# Patient Record
Sex: Female | Born: 1935 | Race: White | Hispanic: No | Marital: Married | Smoking: Never smoker
Health system: Southern US, Community
[De-identification: ages and names within clinical notes are randomized; demographics above are authoritative.]

## PROBLEM LIST (undated history)

## (undated) DIAGNOSIS — I2699 Other pulmonary embolism without acute cor pulmonale: Secondary | ICD-10-CM

## (undated) DIAGNOSIS — F419 Anxiety disorder, unspecified: Secondary | ICD-10-CM

## (undated) DIAGNOSIS — I10 Essential (primary) hypertension: Secondary | ICD-10-CM

## (undated) DIAGNOSIS — M1990 Unspecified osteoarthritis, unspecified site: Secondary | ICD-10-CM

## (undated) DIAGNOSIS — G43909 Migraine, unspecified, not intractable, without status migrainosus: Secondary | ICD-10-CM

## (undated) DIAGNOSIS — T4145XA Adverse effect of unspecified anesthetic, initial encounter: Secondary | ICD-10-CM

## (undated) DIAGNOSIS — K219 Gastro-esophageal reflux disease without esophagitis: Secondary | ICD-10-CM

## (undated) DIAGNOSIS — F329 Major depressive disorder, single episode, unspecified: Secondary | ICD-10-CM

## (undated) DIAGNOSIS — T8859XA Other complications of anesthesia, initial encounter: Secondary | ICD-10-CM

## (undated) DIAGNOSIS — R112 Nausea with vomiting, unspecified: Secondary | ICD-10-CM

## (undated) DIAGNOSIS — Z9889 Other specified postprocedural states: Secondary | ICD-10-CM

## (undated) DIAGNOSIS — F32A Depression, unspecified: Secondary | ICD-10-CM

## (undated) HISTORY — DX: Essential (primary) hypertension: I10

## (undated) HISTORY — PX: FOOT SURGERY: SHX648

## (undated) HISTORY — PX: FINGER AMPUTATION: SHX636

## (undated) HISTORY — DX: Gastro-esophageal reflux disease without esophagitis: K21.9

## (undated) HISTORY — PX: CHOLECYSTECTOMY: SHX55

## (undated) HISTORY — PX: CYST EXCISION: SHX5701

## (undated) HISTORY — DX: Anxiety disorder, unspecified: F41.9

## (undated) HISTORY — PX: SHOULDER ARTHROSCOPY: SHX128

## (undated) HISTORY — DX: Unspecified osteoarthritis, unspecified site: M19.90

## (undated) HISTORY — DX: Other pulmonary embolism without acute cor pulmonale: I26.99

## (undated) HISTORY — DX: Depression, unspecified: F32.A

## (undated) HISTORY — DX: Migraine, unspecified, not intractable, without status migrainosus: G43.909

---
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
## 1969-11-04 HISTORY — PX: HEMORRHOID SURGERY: SHX153

## 1970-11-04 HISTORY — PX: ABDOMINAL HYSTERECTOMY: SHX81

## 1971-11-05 HISTORY — PX: APPENDECTOMY: SHX54

## 1971-11-05 HISTORY — PX: TONSILLECTOMY: SUR1361

## 2003-11-05 HISTORY — PX: KNEE SURGERY: SHX244

## 2014-11-04 HISTORY — PX: OTHER SURGICAL HISTORY: SHX169

## 2016-03-01 DIAGNOSIS — L2089 Other atopic dermatitis: Secondary | ICD-10-CM | POA: Diagnosis not present

## 2016-03-01 DIAGNOSIS — Z6830 Body mass index (BMI) 30.0-30.9, adult: Secondary | ICD-10-CM | POA: Diagnosis not present

## 2016-03-01 DIAGNOSIS — I10 Essential (primary) hypertension: Secondary | ICD-10-CM | POA: Diagnosis not present

## 2016-03-01 DIAGNOSIS — Z0000 Encounter for general adult medical examination without abnormal findings: Secondary | ICD-10-CM | POA: Diagnosis not present

## 2016-03-01 DIAGNOSIS — Z1389 Encounter for screening for other disorder: Secondary | ICD-10-CM | POA: Diagnosis not present

## 2016-04-03 DIAGNOSIS — I700 Atherosclerosis of aorta: Secondary | ICD-10-CM | POA: Diagnosis not present

## 2016-04-03 DIAGNOSIS — M47816 Spondylosis without myelopathy or radiculopathy, lumbar region: Secondary | ICD-10-CM | POA: Diagnosis not present

## 2016-04-03 DIAGNOSIS — M545 Low back pain: Secondary | ICD-10-CM | POA: Diagnosis not present

## 2016-04-03 DIAGNOSIS — M5432 Sciatica, left side: Secondary | ICD-10-CM | POA: Diagnosis not present

## 2016-04-03 DIAGNOSIS — M4316 Spondylolisthesis, lumbar region: Secondary | ICD-10-CM | POA: Diagnosis not present

## 2016-04-03 DIAGNOSIS — Z6829 Body mass index (BMI) 29.0-29.9, adult: Secondary | ICD-10-CM | POA: Diagnosis not present

## 2016-04-22 DIAGNOSIS — M5432 Sciatica, left side: Secondary | ICD-10-CM | POA: Diagnosis not present

## 2016-04-22 DIAGNOSIS — Z6830 Body mass index (BMI) 30.0-30.9, adult: Secondary | ICD-10-CM | POA: Diagnosis not present

## 2016-04-22 DIAGNOSIS — M545 Low back pain: Secondary | ICD-10-CM | POA: Diagnosis not present

## 2016-04-26 DIAGNOSIS — M4806 Spinal stenosis, lumbar region: Secondary | ICD-10-CM | POA: Diagnosis not present

## 2016-04-26 DIAGNOSIS — M5135 Other intervertebral disc degeneration, thoracolumbar region: Secondary | ICD-10-CM | POA: Diagnosis not present

## 2016-04-26 DIAGNOSIS — K5730 Diverticulosis of large intestine without perforation or abscess without bleeding: Secondary | ICD-10-CM | POA: Diagnosis not present

## 2016-04-26 DIAGNOSIS — S3992XA Unspecified injury of lower back, initial encounter: Secondary | ICD-10-CM | POA: Diagnosis not present

## 2016-04-26 DIAGNOSIS — M545 Low back pain: Secondary | ICD-10-CM | POA: Diagnosis not present

## 2016-04-26 DIAGNOSIS — I700 Atherosclerosis of aorta: Secondary | ICD-10-CM | POA: Diagnosis not present

## 2016-05-20 DIAGNOSIS — M4317 Spondylolisthesis, lumbosacral region: Secondary | ICD-10-CM | POA: Diagnosis not present

## 2016-05-29 DIAGNOSIS — M4697 Unspecified inflammatory spondylopathy, lumbosacral region: Secondary | ICD-10-CM | POA: Diagnosis not present

## 2016-05-29 DIAGNOSIS — M5127 Other intervertebral disc displacement, lumbosacral region: Secondary | ICD-10-CM | POA: Diagnosis not present

## 2016-05-29 DIAGNOSIS — M9973 Connective tissue and disc stenosis of intervertebral foramina of lumbar region: Secondary | ICD-10-CM | POA: Diagnosis not present

## 2016-05-29 DIAGNOSIS — M4696 Unspecified inflammatory spondylopathy, lumbar region: Secondary | ICD-10-CM | POA: Diagnosis not present

## 2016-05-29 DIAGNOSIS — M7138 Other bursal cyst, other site: Secondary | ICD-10-CM | POA: Diagnosis not present

## 2016-05-29 DIAGNOSIS — M5136 Other intervertebral disc degeneration, lumbar region: Secondary | ICD-10-CM | POA: Diagnosis not present

## 2016-05-29 DIAGNOSIS — M438X6 Other specified deforming dorsopathies, lumbar region: Secondary | ICD-10-CM | POA: Diagnosis not present

## 2016-06-05 DIAGNOSIS — M4807 Spinal stenosis, lumbosacral region: Secondary | ICD-10-CM | POA: Diagnosis not present

## 2016-06-05 DIAGNOSIS — M4317 Spondylolisthesis, lumbosacral region: Secondary | ICD-10-CM | POA: Diagnosis not present

## 2016-06-06 DIAGNOSIS — M545 Low back pain: Secondary | ICD-10-CM | POA: Diagnosis not present

## 2016-06-06 DIAGNOSIS — I10 Essential (primary) hypertension: Secondary | ICD-10-CM | POA: Diagnosis not present

## 2016-06-06 DIAGNOSIS — I700 Atherosclerosis of aorta: Secondary | ICD-10-CM | POA: Diagnosis not present

## 2016-06-06 DIAGNOSIS — M150 Primary generalized (osteo)arthritis: Secondary | ICD-10-CM | POA: Diagnosis not present

## 2016-06-07 DIAGNOSIS — I700 Atherosclerosis of aorta: Secondary | ICD-10-CM | POA: Diagnosis not present

## 2016-06-07 DIAGNOSIS — M545 Low back pain: Secondary | ICD-10-CM | POA: Diagnosis not present

## 2016-06-07 DIAGNOSIS — M5432 Sciatica, left side: Secondary | ICD-10-CM | POA: Diagnosis not present

## 2016-06-07 DIAGNOSIS — Z6831 Body mass index (BMI) 31.0-31.9, adult: Secondary | ICD-10-CM | POA: Diagnosis not present

## 2016-06-10 DIAGNOSIS — M5440 Lumbago with sciatica, unspecified side: Secondary | ICD-10-CM | POA: Diagnosis not present

## 2016-06-10 DIAGNOSIS — M5416 Radiculopathy, lumbar region: Secondary | ICD-10-CM | POA: Diagnosis not present

## 2016-06-11 DIAGNOSIS — M5416 Radiculopathy, lumbar region: Secondary | ICD-10-CM | POA: Diagnosis not present

## 2016-06-11 DIAGNOSIS — M5440 Lumbago with sciatica, unspecified side: Secondary | ICD-10-CM | POA: Diagnosis not present

## 2016-06-20 DIAGNOSIS — M5416 Radiculopathy, lumbar region: Secondary | ICD-10-CM | POA: Diagnosis not present

## 2016-07-05 DIAGNOSIS — M545 Low back pain: Secondary | ICD-10-CM | POA: Diagnosis not present

## 2016-07-05 DIAGNOSIS — I700 Atherosclerosis of aorta: Secondary | ICD-10-CM | POA: Diagnosis not present

## 2016-07-05 DIAGNOSIS — M150 Primary generalized (osteo)arthritis: Secondary | ICD-10-CM | POA: Diagnosis not present

## 2016-07-05 DIAGNOSIS — M5432 Sciatica, left side: Secondary | ICD-10-CM | POA: Diagnosis not present

## 2016-07-10 DIAGNOSIS — M5415 Radiculopathy, thoracolumbar region: Secondary | ICD-10-CM | POA: Diagnosis not present

## 2016-07-22 DIAGNOSIS — M5416 Radiculopathy, lumbar region: Secondary | ICD-10-CM | POA: Diagnosis not present

## 2016-07-22 DIAGNOSIS — M4317 Spondylolisthesis, lumbosacral region: Secondary | ICD-10-CM | POA: Diagnosis not present

## 2016-07-22 DIAGNOSIS — M47896 Other spondylosis, lumbar region: Secondary | ICD-10-CM | POA: Diagnosis not present

## 2016-07-22 DIAGNOSIS — M4316 Spondylolisthesis, lumbar region: Secondary | ICD-10-CM | POA: Diagnosis not present

## 2016-07-22 DIAGNOSIS — M47816 Spondylosis without myelopathy or radiculopathy, lumbar region: Secondary | ICD-10-CM | POA: Diagnosis not present

## 2016-07-22 DIAGNOSIS — M7138 Other bursal cyst, other site: Secondary | ICD-10-CM | POA: Diagnosis not present

## 2016-07-25 DIAGNOSIS — M545 Low back pain: Secondary | ICD-10-CM | POA: Diagnosis not present

## 2016-07-25 DIAGNOSIS — Z6830 Body mass index (BMI) 30.0-30.9, adult: Secondary | ICD-10-CM | POA: Diagnosis not present

## 2016-07-25 DIAGNOSIS — I700 Atherosclerosis of aorta: Secondary | ICD-10-CM | POA: Diagnosis not present

## 2016-08-01 DIAGNOSIS — K219 Gastro-esophageal reflux disease without esophagitis: Secondary | ICD-10-CM | POA: Diagnosis not present

## 2016-08-01 DIAGNOSIS — Z86718 Personal history of other venous thrombosis and embolism: Secondary | ICD-10-CM | POA: Diagnosis not present

## 2016-08-01 DIAGNOSIS — M5116 Intervertebral disc disorders with radiculopathy, lumbar region: Secondary | ICD-10-CM | POA: Diagnosis not present

## 2016-08-01 DIAGNOSIS — E7800 Pure hypercholesterolemia, unspecified: Secondary | ICD-10-CM | POA: Diagnosis not present

## 2016-08-01 DIAGNOSIS — M7138 Other bursal cyst, other site: Secondary | ICD-10-CM | POA: Diagnosis not present

## 2016-08-01 DIAGNOSIS — I10 Essential (primary) hypertension: Secondary | ICD-10-CM | POA: Diagnosis not present

## 2016-08-01 DIAGNOSIS — Z23 Encounter for immunization: Secondary | ICD-10-CM | POA: Diagnosis not present

## 2016-08-01 DIAGNOSIS — M5418 Radiculopathy, sacral and sacrococcygeal region: Secondary | ICD-10-CM | POA: Diagnosis not present

## 2016-08-01 DIAGNOSIS — R9431 Abnormal electrocardiogram [ECG] [EKG]: Secondary | ICD-10-CM | POA: Diagnosis not present

## 2016-08-01 DIAGNOSIS — F418 Other specified anxiety disorders: Secondary | ICD-10-CM | POA: Diagnosis not present

## 2016-08-12 DIAGNOSIS — M5417 Radiculopathy, lumbosacral region: Secondary | ICD-10-CM | POA: Diagnosis not present

## 2016-08-12 DIAGNOSIS — Z23 Encounter for immunization: Secondary | ICD-10-CM | POA: Diagnosis not present

## 2016-08-12 DIAGNOSIS — Z86718 Personal history of other venous thrombosis and embolism: Secondary | ICD-10-CM | POA: Diagnosis not present

## 2016-08-12 DIAGNOSIS — K219 Gastro-esophageal reflux disease without esophagitis: Secondary | ICD-10-CM | POA: Diagnosis not present

## 2016-08-12 DIAGNOSIS — F418 Other specified anxiety disorders: Secondary | ICD-10-CM | POA: Diagnosis not present

## 2016-08-12 DIAGNOSIS — M5116 Intervertebral disc disorders with radiculopathy, lumbar region: Secondary | ICD-10-CM | POA: Diagnosis not present

## 2016-08-12 DIAGNOSIS — M5418 Radiculopathy, sacral and sacrococcygeal region: Secondary | ICD-10-CM | POA: Diagnosis not present

## 2016-08-12 DIAGNOSIS — E7800 Pure hypercholesterolemia, unspecified: Secondary | ICD-10-CM | POA: Diagnosis not present

## 2016-08-12 DIAGNOSIS — M7138 Other bursal cyst, other site: Secondary | ICD-10-CM | POA: Diagnosis not present

## 2016-08-12 DIAGNOSIS — M5410 Radiculopathy, site unspecified: Secondary | ICD-10-CM | POA: Diagnosis not present

## 2016-08-12 DIAGNOSIS — I10 Essential (primary) hypertension: Secondary | ICD-10-CM | POA: Diagnosis not present

## 2016-08-15 DIAGNOSIS — M1990 Unspecified osteoarthritis, unspecified site: Secondary | ICD-10-CM | POA: Diagnosis not present

## 2016-08-15 DIAGNOSIS — F418 Other specified anxiety disorders: Secondary | ICD-10-CM | POA: Diagnosis not present

## 2016-08-15 DIAGNOSIS — I10 Essential (primary) hypertension: Secondary | ICD-10-CM | POA: Diagnosis not present

## 2016-08-15 DIAGNOSIS — Z79899 Other long term (current) drug therapy: Secondary | ICD-10-CM | POA: Diagnosis not present

## 2016-08-15 DIAGNOSIS — R51 Headache: Secondary | ICD-10-CM | POA: Diagnosis not present

## 2016-08-15 DIAGNOSIS — K449 Diaphragmatic hernia without obstruction or gangrene: Secondary | ICD-10-CM | POA: Diagnosis not present

## 2016-08-15 DIAGNOSIS — N959 Unspecified menopausal and perimenopausal disorder: Secondary | ICD-10-CM | POA: Diagnosis not present

## 2016-08-15 DIAGNOSIS — M810 Age-related osteoporosis without current pathological fracture: Secondary | ICD-10-CM | POA: Diagnosis not present

## 2016-08-15 DIAGNOSIS — G2581 Restless legs syndrome: Secondary | ICD-10-CM | POA: Diagnosis not present

## 2016-08-16 DIAGNOSIS — Z79899 Other long term (current) drug therapy: Secondary | ICD-10-CM | POA: Diagnosis not present

## 2016-08-16 DIAGNOSIS — I10 Essential (primary) hypertension: Secondary | ICD-10-CM | POA: Diagnosis not present

## 2016-08-16 DIAGNOSIS — F418 Other specified anxiety disorders: Secondary | ICD-10-CM | POA: Diagnosis not present

## 2016-08-16 DIAGNOSIS — M810 Age-related osteoporosis without current pathological fracture: Secondary | ICD-10-CM | POA: Diagnosis not present

## 2016-08-16 DIAGNOSIS — R51 Headache: Secondary | ICD-10-CM | POA: Diagnosis not present

## 2016-08-16 DIAGNOSIS — M1990 Unspecified osteoarthritis, unspecified site: Secondary | ICD-10-CM | POA: Diagnosis not present

## 2016-08-16 DIAGNOSIS — K449 Diaphragmatic hernia without obstruction or gangrene: Secondary | ICD-10-CM | POA: Diagnosis not present

## 2016-08-16 DIAGNOSIS — N959 Unspecified menopausal and perimenopausal disorder: Secondary | ICD-10-CM | POA: Diagnosis not present

## 2016-08-16 DIAGNOSIS — G2581 Restless legs syndrome: Secondary | ICD-10-CM | POA: Diagnosis not present

## 2016-08-17 DIAGNOSIS — F418 Other specified anxiety disorders: Secondary | ICD-10-CM | POA: Diagnosis not present

## 2016-08-17 DIAGNOSIS — M810 Age-related osteoporosis without current pathological fracture: Secondary | ICD-10-CM | POA: Diagnosis not present

## 2016-08-17 DIAGNOSIS — I10 Essential (primary) hypertension: Secondary | ICD-10-CM | POA: Diagnosis not present

## 2016-08-17 DIAGNOSIS — R51 Headache: Secondary | ICD-10-CM | POA: Diagnosis not present

## 2016-08-17 DIAGNOSIS — K449 Diaphragmatic hernia without obstruction or gangrene: Secondary | ICD-10-CM | POA: Diagnosis not present

## 2016-08-17 DIAGNOSIS — Z79899 Other long term (current) drug therapy: Secondary | ICD-10-CM | POA: Diagnosis not present

## 2016-08-17 DIAGNOSIS — M1990 Unspecified osteoarthritis, unspecified site: Secondary | ICD-10-CM | POA: Diagnosis not present

## 2016-08-17 DIAGNOSIS — G2581 Restless legs syndrome: Secondary | ICD-10-CM | POA: Diagnosis not present

## 2016-08-17 DIAGNOSIS — N959 Unspecified menopausal and perimenopausal disorder: Secondary | ICD-10-CM | POA: Diagnosis not present

## 2016-08-22 DIAGNOSIS — M545 Low back pain: Secondary | ICD-10-CM | POA: Diagnosis not present

## 2016-08-22 DIAGNOSIS — I700 Atherosclerosis of aorta: Secondary | ICD-10-CM | POA: Diagnosis not present

## 2016-08-22 DIAGNOSIS — Z6831 Body mass index (BMI) 31.0-31.9, adult: Secondary | ICD-10-CM | POA: Diagnosis not present

## 2016-09-24 DIAGNOSIS — I10 Essential (primary) hypertension: Secondary | ICD-10-CM | POA: Diagnosis not present

## 2016-09-24 DIAGNOSIS — I700 Atherosclerosis of aorta: Secondary | ICD-10-CM | POA: Diagnosis not present

## 2016-09-24 DIAGNOSIS — M545 Low back pain: Secondary | ICD-10-CM | POA: Diagnosis not present

## 2016-09-24 DIAGNOSIS — M150 Primary generalized (osteo)arthritis: Secondary | ICD-10-CM | POA: Diagnosis not present

## 2016-10-29 DIAGNOSIS — M150 Primary generalized (osteo)arthritis: Secondary | ICD-10-CM | POA: Diagnosis not present

## 2016-10-29 DIAGNOSIS — I700 Atherosclerosis of aorta: Secondary | ICD-10-CM | POA: Diagnosis not present

## 2016-10-29 DIAGNOSIS — I10 Essential (primary) hypertension: Secondary | ICD-10-CM | POA: Diagnosis not present

## 2016-10-29 DIAGNOSIS — M545 Low back pain: Secondary | ICD-10-CM | POA: Diagnosis not present

## 2016-11-21 DIAGNOSIS — I10 Essential (primary) hypertension: Secondary | ICD-10-CM | POA: Diagnosis not present

## 2016-11-21 DIAGNOSIS — I700 Atherosclerosis of aorta: Secondary | ICD-10-CM | POA: Diagnosis not present

## 2016-11-21 DIAGNOSIS — M150 Primary generalized (osteo)arthritis: Secondary | ICD-10-CM | POA: Diagnosis not present

## 2016-11-21 DIAGNOSIS — M545 Low back pain: Secondary | ICD-10-CM | POA: Diagnosis not present

## 2016-11-29 DIAGNOSIS — M545 Low back pain: Secondary | ICD-10-CM | POA: Diagnosis not present

## 2016-11-29 DIAGNOSIS — Z79899 Other long term (current) drug therapy: Secondary | ICD-10-CM | POA: Diagnosis not present

## 2016-11-29 DIAGNOSIS — I10 Essential (primary) hypertension: Secondary | ICD-10-CM | POA: Diagnosis not present

## 2016-11-29 DIAGNOSIS — Z6831 Body mass index (BMI) 31.0-31.9, adult: Secondary | ICD-10-CM | POA: Diagnosis not present

## 2016-11-29 DIAGNOSIS — I700 Atherosclerosis of aorta: Secondary | ICD-10-CM | POA: Diagnosis not present

## 2016-12-13 DIAGNOSIS — M545 Low back pain: Secondary | ICD-10-CM | POA: Diagnosis not present

## 2016-12-13 DIAGNOSIS — I700 Atherosclerosis of aorta: Secondary | ICD-10-CM | POA: Diagnosis not present

## 2016-12-13 DIAGNOSIS — I10 Essential (primary) hypertension: Secondary | ICD-10-CM | POA: Diagnosis not present

## 2016-12-13 DIAGNOSIS — M150 Primary generalized (osteo)arthritis: Secondary | ICD-10-CM | POA: Diagnosis not present

## 2016-12-30 DIAGNOSIS — M19071 Primary osteoarthritis, right ankle and foot: Secondary | ICD-10-CM | POA: Diagnosis not present

## 2017-01-07 DIAGNOSIS — M150 Primary generalized (osteo)arthritis: Secondary | ICD-10-CM | POA: Diagnosis not present

## 2017-01-07 DIAGNOSIS — M545 Low back pain: Secondary | ICD-10-CM | POA: Diagnosis not present

## 2017-01-07 DIAGNOSIS — I10 Essential (primary) hypertension: Secondary | ICD-10-CM | POA: Diagnosis not present

## 2017-01-07 DIAGNOSIS — I700 Atherosclerosis of aorta: Secondary | ICD-10-CM | POA: Diagnosis not present

## 2017-02-10 DIAGNOSIS — I10 Essential (primary) hypertension: Secondary | ICD-10-CM | POA: Diagnosis not present

## 2017-02-10 DIAGNOSIS — I700 Atherosclerosis of aorta: Secondary | ICD-10-CM | POA: Diagnosis not present

## 2017-02-10 DIAGNOSIS — M545 Low back pain: Secondary | ICD-10-CM | POA: Diagnosis not present

## 2017-02-10 DIAGNOSIS — M150 Primary generalized (osteo)arthritis: Secondary | ICD-10-CM | POA: Diagnosis not present

## 2017-02-21 DIAGNOSIS — M545 Low back pain: Secondary | ICD-10-CM | POA: Diagnosis not present

## 2017-02-21 DIAGNOSIS — Z6831 Body mass index (BMI) 31.0-31.9, adult: Secondary | ICD-10-CM | POA: Diagnosis not present

## 2017-02-21 DIAGNOSIS — I700 Atherosclerosis of aorta: Secondary | ICD-10-CM | POA: Diagnosis not present

## 2017-02-21 DIAGNOSIS — I10 Essential (primary) hypertension: Secondary | ICD-10-CM | POA: Diagnosis not present

## 2017-03-24 DIAGNOSIS — M545 Low back pain: Secondary | ICD-10-CM | POA: Diagnosis not present

## 2017-03-24 DIAGNOSIS — I700 Atherosclerosis of aorta: Secondary | ICD-10-CM | POA: Diagnosis not present

## 2017-03-24 DIAGNOSIS — I10 Essential (primary) hypertension: Secondary | ICD-10-CM | POA: Diagnosis not present

## 2017-04-28 DIAGNOSIS — M545 Low back pain: Secondary | ICD-10-CM | POA: Diagnosis not present

## 2017-04-28 DIAGNOSIS — I700 Atherosclerosis of aorta: Secondary | ICD-10-CM | POA: Diagnosis not present

## 2017-04-28 DIAGNOSIS — I10 Essential (primary) hypertension: Secondary | ICD-10-CM | POA: Diagnosis not present

## 2017-05-29 DIAGNOSIS — I700 Atherosclerosis of aorta: Secondary | ICD-10-CM | POA: Diagnosis not present

## 2017-05-29 DIAGNOSIS — I10 Essential (primary) hypertension: Secondary | ICD-10-CM | POA: Diagnosis not present

## 2017-05-29 DIAGNOSIS — L308 Other specified dermatitis: Secondary | ICD-10-CM | POA: Diagnosis not present

## 2017-05-29 DIAGNOSIS — M545 Low back pain: Secondary | ICD-10-CM | POA: Diagnosis not present

## 2017-05-29 DIAGNOSIS — Z6829 Body mass index (BMI) 29.0-29.9, adult: Secondary | ICD-10-CM | POA: Diagnosis not present

## 2017-05-29 DIAGNOSIS — K219 Gastro-esophageal reflux disease without esophagitis: Secondary | ICD-10-CM | POA: Diagnosis not present

## 2017-06-17 DIAGNOSIS — I10 Essential (primary) hypertension: Secondary | ICD-10-CM | POA: Diagnosis not present

## 2017-06-17 DIAGNOSIS — M545 Low back pain: Secondary | ICD-10-CM | POA: Diagnosis not present

## 2017-06-17 DIAGNOSIS — I700 Atherosclerosis of aorta: Secondary | ICD-10-CM | POA: Diagnosis not present

## 2017-06-17 DIAGNOSIS — M150 Primary generalized (osteo)arthritis: Secondary | ICD-10-CM | POA: Diagnosis not present

## 2017-07-15 DIAGNOSIS — M150 Primary generalized (osteo)arthritis: Secondary | ICD-10-CM | POA: Diagnosis not present

## 2017-07-15 DIAGNOSIS — I10 Essential (primary) hypertension: Secondary | ICD-10-CM | POA: Diagnosis not present

## 2017-07-15 DIAGNOSIS — M545 Low back pain: Secondary | ICD-10-CM | POA: Diagnosis not present

## 2017-08-29 DIAGNOSIS — I10 Essential (primary) hypertension: Secondary | ICD-10-CM | POA: Diagnosis not present

## 2017-08-29 DIAGNOSIS — M150 Primary generalized (osteo)arthritis: Secondary | ICD-10-CM | POA: Diagnosis not present

## 2017-08-29 DIAGNOSIS — M545 Low back pain: Secondary | ICD-10-CM | POA: Diagnosis not present

## 2017-09-01 DIAGNOSIS — M545 Low back pain: Secondary | ICD-10-CM | POA: Diagnosis not present

## 2017-09-01 DIAGNOSIS — I10 Essential (primary) hypertension: Secondary | ICD-10-CM | POA: Diagnosis not present

## 2017-09-01 DIAGNOSIS — Z1389 Encounter for screening for other disorder: Secondary | ICD-10-CM | POA: Diagnosis not present

## 2017-09-01 DIAGNOSIS — Z6830 Body mass index (BMI) 30.0-30.9, adult: Secondary | ICD-10-CM | POA: Diagnosis not present

## 2017-09-01 DIAGNOSIS — K219 Gastro-esophageal reflux disease without esophagitis: Secondary | ICD-10-CM | POA: Diagnosis not present

## 2017-09-01 DIAGNOSIS — L308 Other specified dermatitis: Secondary | ICD-10-CM | POA: Diagnosis not present

## 2017-09-01 DIAGNOSIS — Z6829 Body mass index (BMI) 29.0-29.9, adult: Secondary | ICD-10-CM | POA: Diagnosis not present

## 2017-09-01 DIAGNOSIS — Z0000 Encounter for general adult medical examination without abnormal findings: Secondary | ICD-10-CM | POA: Diagnosis not present

## 2017-09-01 DIAGNOSIS — I700 Atherosclerosis of aorta: Secondary | ICD-10-CM | POA: Diagnosis not present

## 2017-10-06 DIAGNOSIS — K219 Gastro-esophageal reflux disease without esophagitis: Secondary | ICD-10-CM | POA: Diagnosis not present

## 2017-10-06 DIAGNOSIS — M545 Low back pain: Secondary | ICD-10-CM | POA: Diagnosis not present

## 2017-10-06 DIAGNOSIS — I10 Essential (primary) hypertension: Secondary | ICD-10-CM | POA: Diagnosis not present

## 2017-10-14 DIAGNOSIS — M810 Age-related osteoporosis without current pathological fracture: Secondary | ICD-10-CM | POA: Diagnosis not present

## 2017-10-14 DIAGNOSIS — M85851 Other specified disorders of bone density and structure, right thigh: Secondary | ICD-10-CM | POA: Diagnosis not present

## 2017-11-05 DIAGNOSIS — M545 Low back pain: Secondary | ICD-10-CM | POA: Diagnosis not present

## 2017-11-05 DIAGNOSIS — K219 Gastro-esophageal reflux disease without esophagitis: Secondary | ICD-10-CM | POA: Diagnosis not present

## 2017-11-05 DIAGNOSIS — I10 Essential (primary) hypertension: Secondary | ICD-10-CM | POA: Diagnosis not present

## 2017-11-13 DIAGNOSIS — M069 Rheumatoid arthritis, unspecified: Secondary | ICD-10-CM | POA: Diagnosis not present

## 2017-11-13 DIAGNOSIS — I10 Essential (primary) hypertension: Secondary | ICD-10-CM | POA: Diagnosis not present

## 2017-11-13 DIAGNOSIS — F341 Dysthymic disorder: Secondary | ICD-10-CM | POA: Diagnosis not present

## 2017-11-13 DIAGNOSIS — S62631B Displaced fracture of distal phalanx of left index finger, initial encounter for open fracture: Secondary | ICD-10-CM | POA: Diagnosis not present

## 2017-11-13 DIAGNOSIS — S6992XA Unspecified injury of left wrist, hand and finger(s), initial encounter: Secondary | ICD-10-CM | POA: Diagnosis not present

## 2017-11-13 DIAGNOSIS — W230XXA Caught, crushed, jammed, or pinched between moving objects, initial encounter: Secondary | ICD-10-CM | POA: Diagnosis not present

## 2017-11-13 DIAGNOSIS — T402X5A Adverse effect of other opioids, initial encounter: Secondary | ICD-10-CM | POA: Diagnosis not present

## 2017-11-13 DIAGNOSIS — Z79899 Other long term (current) drug therapy: Secondary | ICD-10-CM | POA: Diagnosis not present

## 2017-11-13 DIAGNOSIS — S6990XA Unspecified injury of unspecified wrist, hand and finger(s), initial encounter: Secondary | ICD-10-CM | POA: Diagnosis not present

## 2017-11-13 DIAGNOSIS — Y9269 Other specified industrial and construction area as the place of occurrence of the external cause: Secondary | ICD-10-CM | POA: Diagnosis not present

## 2017-11-13 DIAGNOSIS — S62625A Displaced fracture of medial phalanx of left ring finger, initial encounter for closed fracture: Secondary | ICD-10-CM | POA: Diagnosis not present

## 2017-11-13 DIAGNOSIS — E7800 Pure hypercholesterolemia, unspecified: Secondary | ICD-10-CM | POA: Diagnosis not present

## 2017-11-13 DIAGNOSIS — R030 Elevated blood-pressure reading, without diagnosis of hypertension: Secondary | ICD-10-CM | POA: Diagnosis not present

## 2017-11-13 DIAGNOSIS — J9811 Atelectasis: Secondary | ICD-10-CM | POA: Diagnosis not present

## 2017-11-13 DIAGNOSIS — S68121A Partial traumatic metacarpophalangeal amputation of left index finger, initial encounter: Secondary | ICD-10-CM | POA: Diagnosis not present

## 2017-11-13 DIAGNOSIS — T50905A Adverse effect of unspecified drugs, medicaments and biological substances, initial encounter: Secondary | ICD-10-CM | POA: Diagnosis not present

## 2017-11-13 DIAGNOSIS — Z23 Encounter for immunization: Secondary | ICD-10-CM | POA: Diagnosis not present

## 2017-11-13 DIAGNOSIS — R9431 Abnormal electrocardiogram [ECG] [EKG]: Secondary | ICD-10-CM | POA: Diagnosis not present

## 2017-11-13 DIAGNOSIS — S61313A Laceration without foreign body of left middle finger with damage to nail, initial encounter: Secondary | ICD-10-CM | POA: Diagnosis not present

## 2017-11-13 DIAGNOSIS — S68611A Complete traumatic transphalangeal amputation of left index finger, initial encounter: Secondary | ICD-10-CM | POA: Diagnosis not present

## 2017-11-13 DIAGNOSIS — M62838 Other muscle spasm: Secondary | ICD-10-CM | POA: Diagnosis not present

## 2017-11-13 DIAGNOSIS — Y990 Civilian activity done for income or pay: Secondary | ICD-10-CM | POA: Diagnosis not present

## 2017-11-14 DIAGNOSIS — Z23 Encounter for immunization: Secondary | ICD-10-CM | POA: Diagnosis not present

## 2017-11-14 DIAGNOSIS — S68611A Complete traumatic transphalangeal amputation of left index finger, initial encounter: Secondary | ICD-10-CM | POA: Diagnosis not present

## 2017-11-14 DIAGNOSIS — S61313A Laceration without foreign body of left middle finger with damage to nail, initial encounter: Secondary | ICD-10-CM | POA: Diagnosis not present

## 2017-11-14 DIAGNOSIS — S62625A Displaced fracture of medial phalanx of left ring finger, initial encounter for closed fracture: Secondary | ICD-10-CM | POA: Diagnosis not present

## 2017-11-21 DIAGNOSIS — S68111D Complete traumatic metacarpophalangeal amputation of left index finger, subsequent encounter: Secondary | ICD-10-CM | POA: Diagnosis not present

## 2017-12-03 DIAGNOSIS — S62625D Displaced fracture of medial phalanx of left ring finger, subsequent encounter for fracture with routine healing: Secondary | ICD-10-CM | POA: Diagnosis not present

## 2017-12-03 DIAGNOSIS — S68623D Partial traumatic transphalangeal amputation of left middle finger, subsequent encounter: Secondary | ICD-10-CM | POA: Diagnosis not present

## 2017-12-03 DIAGNOSIS — S61223D Laceration with foreign body of left middle finger without damage to nail, subsequent encounter: Secondary | ICD-10-CM | POA: Diagnosis not present

## 2017-12-03 DIAGNOSIS — M85842 Other specified disorders of bone density and structure, left hand: Secondary | ICD-10-CM | POA: Diagnosis not present

## 2017-12-03 DIAGNOSIS — M19042 Primary osteoarthritis, left hand: Secondary | ICD-10-CM | POA: Diagnosis not present

## 2017-12-03 DIAGNOSIS — S68621D Partial traumatic transphalangeal amputation of left index finger, subsequent encounter: Secondary | ICD-10-CM | POA: Diagnosis not present

## 2017-12-03 DIAGNOSIS — S63042D Subluxation of carpometacarpal joint of left thumb, subsequent encounter: Secondary | ICD-10-CM | POA: Diagnosis not present

## 2017-12-03 DIAGNOSIS — S68111D Complete traumatic metacarpophalangeal amputation of left index finger, subsequent encounter: Secondary | ICD-10-CM | POA: Diagnosis not present

## 2017-12-04 DIAGNOSIS — I700 Atherosclerosis of aorta: Secondary | ICD-10-CM | POA: Diagnosis not present

## 2017-12-04 DIAGNOSIS — K219 Gastro-esophageal reflux disease without esophagitis: Secondary | ICD-10-CM | POA: Diagnosis not present

## 2017-12-04 DIAGNOSIS — Z6829 Body mass index (BMI) 29.0-29.9, adult: Secondary | ICD-10-CM | POA: Diagnosis not present

## 2017-12-04 DIAGNOSIS — I10 Essential (primary) hypertension: Secondary | ICD-10-CM | POA: Diagnosis not present

## 2017-12-05 DIAGNOSIS — K219 Gastro-esophageal reflux disease without esophagitis: Secondary | ICD-10-CM | POA: Diagnosis not present

## 2017-12-05 DIAGNOSIS — M150 Primary generalized (osteo)arthritis: Secondary | ICD-10-CM | POA: Diagnosis not present

## 2017-12-05 DIAGNOSIS — I10 Essential (primary) hypertension: Secondary | ICD-10-CM | POA: Diagnosis not present

## 2018-01-12 DIAGNOSIS — M150 Primary generalized (osteo)arthritis: Secondary | ICD-10-CM | POA: Diagnosis not present

## 2018-01-12 DIAGNOSIS — I10 Essential (primary) hypertension: Secondary | ICD-10-CM | POA: Diagnosis not present

## 2018-01-12 DIAGNOSIS — K219 Gastro-esophageal reflux disease without esophagitis: Secondary | ICD-10-CM | POA: Diagnosis not present

## 2018-01-13 DIAGNOSIS — S68111D Complete traumatic metacarpophalangeal amputation of left index finger, subsequent encounter: Secondary | ICD-10-CM | POA: Diagnosis not present

## 2018-01-13 DIAGNOSIS — S62625D Displaced fracture of medial phalanx of left ring finger, subsequent encounter for fracture with routine healing: Secondary | ICD-10-CM | POA: Diagnosis not present

## 2018-01-13 DIAGNOSIS — S61223D Laceration with foreign body of left middle finger without damage to nail, subsequent encounter: Secondary | ICD-10-CM | POA: Diagnosis not present

## 2018-01-13 DIAGNOSIS — Z89022 Acquired absence of left finger(s): Secondary | ICD-10-CM | POA: Diagnosis not present

## 2018-02-16 DIAGNOSIS — M545 Low back pain: Secondary | ICD-10-CM | POA: Diagnosis not present

## 2018-02-16 DIAGNOSIS — Z6828 Body mass index (BMI) 28.0-28.9, adult: Secondary | ICD-10-CM | POA: Diagnosis not present

## 2018-02-17 DIAGNOSIS — M5137 Other intervertebral disc degeneration, lumbosacral region: Secondary | ICD-10-CM | POA: Diagnosis not present

## 2018-02-17 DIAGNOSIS — M47814 Spondylosis without myelopathy or radiculopathy, thoracic region: Secondary | ICD-10-CM | POA: Diagnosis not present

## 2018-02-17 DIAGNOSIS — M4316 Spondylolisthesis, lumbar region: Secondary | ICD-10-CM | POA: Diagnosis not present

## 2018-02-17 DIAGNOSIS — M5136 Other intervertebral disc degeneration, lumbar region: Secondary | ICD-10-CM | POA: Diagnosis not present

## 2018-02-17 DIAGNOSIS — M545 Low back pain: Secondary | ICD-10-CM | POA: Diagnosis not present

## 2018-02-17 DIAGNOSIS — M40204 Unspecified kyphosis, thoracic region: Secondary | ICD-10-CM | POA: Diagnosis not present

## 2018-02-17 DIAGNOSIS — M4184 Other forms of scoliosis, thoracic region: Secondary | ICD-10-CM | POA: Diagnosis not present

## 2018-02-17 DIAGNOSIS — M5135 Other intervertebral disc degeneration, thoracolumbar region: Secondary | ICD-10-CM | POA: Diagnosis not present

## 2018-02-24 DIAGNOSIS — M549 Dorsalgia, unspecified: Secondary | ICD-10-CM | POA: Diagnosis not present

## 2018-02-24 DIAGNOSIS — M4316 Spondylolisthesis, lumbar region: Secondary | ICD-10-CM | POA: Diagnosis not present

## 2018-02-24 DIAGNOSIS — M419 Scoliosis, unspecified: Secondary | ICD-10-CM | POA: Diagnosis not present

## 2018-02-24 DIAGNOSIS — M5135 Other intervertebral disc degeneration, thoracolumbar region: Secondary | ICD-10-CM | POA: Diagnosis not present

## 2018-03-03 DIAGNOSIS — K219 Gastro-esophageal reflux disease without esophagitis: Secondary | ICD-10-CM | POA: Diagnosis not present

## 2018-03-03 DIAGNOSIS — M150 Primary generalized (osteo)arthritis: Secondary | ICD-10-CM | POA: Diagnosis not present

## 2018-03-03 DIAGNOSIS — I10 Essential (primary) hypertension: Secondary | ICD-10-CM | POA: Diagnosis not present

## 2018-03-11 DIAGNOSIS — F329 Major depressive disorder, single episode, unspecified: Secondary | ICD-10-CM | POA: Diagnosis not present

## 2018-03-11 DIAGNOSIS — M545 Low back pain: Secondary | ICD-10-CM | POA: Diagnosis not present

## 2018-03-11 DIAGNOSIS — I10 Essential (primary) hypertension: Secondary | ICD-10-CM | POA: Diagnosis not present

## 2018-03-11 DIAGNOSIS — E7849 Other hyperlipidemia: Secondary | ICD-10-CM | POA: Diagnosis not present

## 2018-03-11 DIAGNOSIS — K210 Gastro-esophageal reflux disease with esophagitis: Secondary | ICD-10-CM | POA: Diagnosis not present

## 2018-03-11 DIAGNOSIS — M546 Pain in thoracic spine: Secondary | ICD-10-CM | POA: Diagnosis not present

## 2018-03-11 DIAGNOSIS — Z6829 Body mass index (BMI) 29.0-29.9, adult: Secondary | ICD-10-CM | POA: Diagnosis not present

## 2018-03-13 DIAGNOSIS — M6281 Muscle weakness (generalized): Secondary | ICD-10-CM | POA: Diagnosis not present

## 2018-03-13 DIAGNOSIS — M4316 Spondylolisthesis, lumbar region: Secondary | ICD-10-CM | POA: Diagnosis not present

## 2018-03-13 DIAGNOSIS — M546 Pain in thoracic spine: Secondary | ICD-10-CM | POA: Diagnosis not present

## 2018-03-13 DIAGNOSIS — M545 Low back pain: Secondary | ICD-10-CM | POA: Diagnosis not present

## 2018-03-13 DIAGNOSIS — M5135 Other intervertebral disc degeneration, thoracolumbar region: Secondary | ICD-10-CM | POA: Diagnosis not present

## 2018-03-13 DIAGNOSIS — R2689 Other abnormalities of gait and mobility: Secondary | ICD-10-CM | POA: Diagnosis not present

## 2018-03-16 DIAGNOSIS — M5135 Other intervertebral disc degeneration, thoracolumbar region: Secondary | ICD-10-CM | POA: Diagnosis not present

## 2018-03-16 DIAGNOSIS — M546 Pain in thoracic spine: Secondary | ICD-10-CM | POA: Diagnosis not present

## 2018-03-16 DIAGNOSIS — R2689 Other abnormalities of gait and mobility: Secondary | ICD-10-CM | POA: Diagnosis not present

## 2018-03-16 DIAGNOSIS — M6281 Muscle weakness (generalized): Secondary | ICD-10-CM | POA: Diagnosis not present

## 2018-03-16 DIAGNOSIS — M4316 Spondylolisthesis, lumbar region: Secondary | ICD-10-CM | POA: Diagnosis not present

## 2018-03-16 DIAGNOSIS — M545 Low back pain: Secondary | ICD-10-CM | POA: Diagnosis not present

## 2018-03-19 DIAGNOSIS — M4316 Spondylolisthesis, lumbar region: Secondary | ICD-10-CM | POA: Diagnosis not present

## 2018-03-19 DIAGNOSIS — R2689 Other abnormalities of gait and mobility: Secondary | ICD-10-CM | POA: Diagnosis not present

## 2018-03-19 DIAGNOSIS — M545 Low back pain: Secondary | ICD-10-CM | POA: Diagnosis not present

## 2018-03-19 DIAGNOSIS — M6281 Muscle weakness (generalized): Secondary | ICD-10-CM | POA: Diagnosis not present

## 2018-03-19 DIAGNOSIS — M5135 Other intervertebral disc degeneration, thoracolumbar region: Secondary | ICD-10-CM | POA: Diagnosis not present

## 2018-03-19 DIAGNOSIS — M546 Pain in thoracic spine: Secondary | ICD-10-CM | POA: Diagnosis not present

## 2018-03-23 DIAGNOSIS — M546 Pain in thoracic spine: Secondary | ICD-10-CM | POA: Diagnosis not present

## 2018-03-23 DIAGNOSIS — M6281 Muscle weakness (generalized): Secondary | ICD-10-CM | POA: Diagnosis not present

## 2018-03-23 DIAGNOSIS — M545 Low back pain: Secondary | ICD-10-CM | POA: Diagnosis not present

## 2018-03-23 DIAGNOSIS — R2689 Other abnormalities of gait and mobility: Secondary | ICD-10-CM | POA: Diagnosis not present

## 2018-03-23 DIAGNOSIS — M4316 Spondylolisthesis, lumbar region: Secondary | ICD-10-CM | POA: Diagnosis not present

## 2018-03-23 DIAGNOSIS — M5135 Other intervertebral disc degeneration, thoracolumbar region: Secondary | ICD-10-CM | POA: Diagnosis not present

## 2018-03-26 DIAGNOSIS — M546 Pain in thoracic spine: Secondary | ICD-10-CM | POA: Diagnosis not present

## 2018-03-26 DIAGNOSIS — M6281 Muscle weakness (generalized): Secondary | ICD-10-CM | POA: Diagnosis not present

## 2018-03-26 DIAGNOSIS — M5135 Other intervertebral disc degeneration, thoracolumbar region: Secondary | ICD-10-CM | POA: Diagnosis not present

## 2018-03-26 DIAGNOSIS — M545 Low back pain: Secondary | ICD-10-CM | POA: Diagnosis not present

## 2018-03-26 DIAGNOSIS — M4316 Spondylolisthesis, lumbar region: Secondary | ICD-10-CM | POA: Diagnosis not present

## 2018-03-26 DIAGNOSIS — R2689 Other abnormalities of gait and mobility: Secondary | ICD-10-CM | POA: Diagnosis not present

## 2018-04-15 DIAGNOSIS — E7849 Other hyperlipidemia: Secondary | ICD-10-CM | POA: Diagnosis not present

## 2018-04-15 DIAGNOSIS — I10 Essential (primary) hypertension: Secondary | ICD-10-CM | POA: Diagnosis not present

## 2018-04-15 DIAGNOSIS — K210 Gastro-esophageal reflux disease with esophagitis: Secondary | ICD-10-CM | POA: Diagnosis not present

## 2018-04-15 DIAGNOSIS — M545 Low back pain: Secondary | ICD-10-CM | POA: Diagnosis not present

## 2018-06-09 DIAGNOSIS — M545 Low back pain: Secondary | ICD-10-CM | POA: Diagnosis not present

## 2018-06-09 DIAGNOSIS — K210 Gastro-esophageal reflux disease with esophagitis: Secondary | ICD-10-CM | POA: Diagnosis not present

## 2018-06-09 DIAGNOSIS — I10 Essential (primary) hypertension: Secondary | ICD-10-CM | POA: Diagnosis not present

## 2018-06-09 DIAGNOSIS — E7849 Other hyperlipidemia: Secondary | ICD-10-CM | POA: Diagnosis not present

## 2018-06-22 DIAGNOSIS — F329 Major depressive disorder, single episode, unspecified: Secondary | ICD-10-CM | POA: Diagnosis not present

## 2018-06-22 DIAGNOSIS — M545 Low back pain: Secondary | ICD-10-CM | POA: Diagnosis not present

## 2018-06-22 DIAGNOSIS — I10 Essential (primary) hypertension: Secondary | ICD-10-CM | POA: Diagnosis not present

## 2018-06-22 DIAGNOSIS — E7849 Other hyperlipidemia: Secondary | ICD-10-CM | POA: Diagnosis not present

## 2018-06-22 DIAGNOSIS — K210 Gastro-esophageal reflux disease with esophagitis: Secondary | ICD-10-CM | POA: Diagnosis not present

## 2018-06-22 DIAGNOSIS — Z0000 Encounter for general adult medical examination without abnormal findings: Secondary | ICD-10-CM | POA: Diagnosis not present

## 2018-06-22 DIAGNOSIS — Z6829 Body mass index (BMI) 29.0-29.9, adult: Secondary | ICD-10-CM | POA: Diagnosis not present

## 2018-07-10 DIAGNOSIS — I700 Atherosclerosis of aorta: Secondary | ICD-10-CM | POA: Diagnosis not present

## 2018-07-10 DIAGNOSIS — M545 Low back pain: Secondary | ICD-10-CM | POA: Diagnosis not present

## 2018-07-10 DIAGNOSIS — M150 Primary generalized (osteo)arthritis: Secondary | ICD-10-CM | POA: Diagnosis not present

## 2018-07-10 DIAGNOSIS — I10 Essential (primary) hypertension: Secondary | ICD-10-CM | POA: Diagnosis not present

## 2018-08-20 DIAGNOSIS — M545 Low back pain: Secondary | ICD-10-CM | POA: Diagnosis not present

## 2018-08-20 DIAGNOSIS — Z6829 Body mass index (BMI) 29.0-29.9, adult: Secondary | ICD-10-CM | POA: Diagnosis not present

## 2018-08-20 DIAGNOSIS — F418 Other specified anxiety disorders: Secondary | ICD-10-CM | POA: Diagnosis not present

## 2018-08-20 DIAGNOSIS — I10 Essential (primary) hypertension: Secondary | ICD-10-CM | POA: Diagnosis not present

## 2018-08-20 DIAGNOSIS — K210 Gastro-esophageal reflux disease with esophagitis: Secondary | ICD-10-CM | POA: Diagnosis not present

## 2018-08-27 DIAGNOSIS — H9193 Unspecified hearing loss, bilateral: Secondary | ICD-10-CM | POA: Diagnosis not present

## 2018-08-27 DIAGNOSIS — F419 Anxiety disorder, unspecified: Secondary | ICD-10-CM | POA: Diagnosis not present

## 2018-08-27 DIAGNOSIS — E663 Overweight: Secondary | ICD-10-CM | POA: Diagnosis not present

## 2018-08-27 DIAGNOSIS — G250 Essential tremor: Secondary | ICD-10-CM | POA: Diagnosis not present

## 2018-08-27 DIAGNOSIS — I10 Essential (primary) hypertension: Secondary | ICD-10-CM | POA: Diagnosis not present

## 2018-08-27 DIAGNOSIS — F3340 Major depressive disorder, recurrent, in remission, unspecified: Secondary | ICD-10-CM | POA: Diagnosis not present

## 2018-08-27 DIAGNOSIS — E785 Hyperlipidemia, unspecified: Secondary | ICD-10-CM | POA: Diagnosis not present

## 2018-08-27 DIAGNOSIS — G2581 Restless legs syndrome: Secondary | ICD-10-CM | POA: Diagnosis not present

## 2018-08-27 DIAGNOSIS — G629 Polyneuropathy, unspecified: Secondary | ICD-10-CM | POA: Diagnosis not present

## 2018-09-15 DIAGNOSIS — M545 Low back pain: Secondary | ICD-10-CM | POA: Diagnosis not present

## 2018-09-15 DIAGNOSIS — I10 Essential (primary) hypertension: Secondary | ICD-10-CM | POA: Diagnosis not present

## 2018-09-15 DIAGNOSIS — K210 Gastro-esophageal reflux disease with esophagitis: Secondary | ICD-10-CM | POA: Diagnosis not present

## 2018-09-15 DIAGNOSIS — F418 Other specified anxiety disorders: Secondary | ICD-10-CM | POA: Diagnosis not present

## 2018-09-24 DIAGNOSIS — I10 Essential (primary) hypertension: Secondary | ICD-10-CM | POA: Diagnosis not present

## 2018-09-24 DIAGNOSIS — Z6829 Body mass index (BMI) 29.0-29.9, adult: Secondary | ICD-10-CM | POA: Diagnosis not present

## 2018-09-24 DIAGNOSIS — Z0000 Encounter for general adult medical examination without abnormal findings: Secondary | ICD-10-CM | POA: Diagnosis not present

## 2018-09-24 DIAGNOSIS — F418 Other specified anxiety disorders: Secondary | ICD-10-CM | POA: Diagnosis not present

## 2018-09-24 DIAGNOSIS — K210 Gastro-esophageal reflux disease with esophagitis: Secondary | ICD-10-CM | POA: Diagnosis not present

## 2018-09-24 DIAGNOSIS — M545 Low back pain: Secondary | ICD-10-CM | POA: Diagnosis not present

## 2018-09-24 DIAGNOSIS — Z1389 Encounter for screening for other disorder: Secondary | ICD-10-CM | POA: Diagnosis not present

## 2018-11-13 DIAGNOSIS — I10 Essential (primary) hypertension: Secondary | ICD-10-CM | POA: Diagnosis not present

## 2018-11-13 DIAGNOSIS — K210 Gastro-esophageal reflux disease with esophagitis: Secondary | ICD-10-CM | POA: Diagnosis not present

## 2018-11-13 DIAGNOSIS — M545 Low back pain: Secondary | ICD-10-CM | POA: Diagnosis not present

## 2018-11-13 DIAGNOSIS — F418 Other specified anxiety disorders: Secondary | ICD-10-CM | POA: Diagnosis not present

## 2018-12-07 DIAGNOSIS — H40033 Anatomical narrow angle, bilateral: Secondary | ICD-10-CM | POA: Diagnosis not present

## 2018-12-07 DIAGNOSIS — H2513 Age-related nuclear cataract, bilateral: Secondary | ICD-10-CM | POA: Diagnosis not present

## 2018-12-21 DIAGNOSIS — K210 Gastro-esophageal reflux disease with esophagitis: Secondary | ICD-10-CM | POA: Diagnosis not present

## 2018-12-21 DIAGNOSIS — F418 Other specified anxiety disorders: Secondary | ICD-10-CM | POA: Diagnosis not present

## 2018-12-21 DIAGNOSIS — Z6829 Body mass index (BMI) 29.0-29.9, adult: Secondary | ICD-10-CM | POA: Diagnosis not present

## 2018-12-21 DIAGNOSIS — Z0000 Encounter for general adult medical examination without abnormal findings: Secondary | ICD-10-CM | POA: Diagnosis not present

## 2018-12-21 DIAGNOSIS — M545 Low back pain: Secondary | ICD-10-CM | POA: Diagnosis not present

## 2018-12-21 DIAGNOSIS — I10 Essential (primary) hypertension: Secondary | ICD-10-CM | POA: Diagnosis not present

## 2018-12-21 DIAGNOSIS — Z1389 Encounter for screening for other disorder: Secondary | ICD-10-CM | POA: Diagnosis not present

## 2018-12-28 DIAGNOSIS — M545 Low back pain: Secondary | ICD-10-CM | POA: Diagnosis not present

## 2018-12-28 DIAGNOSIS — E7849 Other hyperlipidemia: Secondary | ICD-10-CM | POA: Diagnosis not present

## 2018-12-28 DIAGNOSIS — I10 Essential (primary) hypertension: Secondary | ICD-10-CM | POA: Diagnosis not present

## 2019-01-01 DIAGNOSIS — M5135 Other intervertebral disc degeneration, thoracolumbar region: Secondary | ICD-10-CM | POA: Diagnosis not present

## 2019-01-01 DIAGNOSIS — M1712 Unilateral primary osteoarthritis, left knee: Secondary | ICD-10-CM | POA: Diagnosis not present

## 2019-01-01 DIAGNOSIS — M1611 Unilateral primary osteoarthritis, right hip: Secondary | ICD-10-CM | POA: Diagnosis not present

## 2019-01-01 DIAGNOSIS — M1612 Unilateral primary osteoarthritis, left hip: Secondary | ICD-10-CM | POA: Diagnosis not present

## 2019-01-13 DIAGNOSIS — M25552 Pain in left hip: Secondary | ICD-10-CM | POA: Diagnosis not present

## 2019-01-13 DIAGNOSIS — M1611 Unilateral primary osteoarthritis, right hip: Secondary | ICD-10-CM | POA: Diagnosis not present

## 2019-01-13 DIAGNOSIS — R2689 Other abnormalities of gait and mobility: Secondary | ICD-10-CM | POA: Diagnosis not present

## 2019-01-13 DIAGNOSIS — M5135 Other intervertebral disc degeneration, thoracolumbar region: Secondary | ICD-10-CM | POA: Diagnosis not present

## 2019-01-13 DIAGNOSIS — M6281 Muscle weakness (generalized): Secondary | ICD-10-CM | POA: Diagnosis not present

## 2019-01-15 DIAGNOSIS — M25552 Pain in left hip: Secondary | ICD-10-CM | POA: Diagnosis not present

## 2019-01-15 DIAGNOSIS — M1611 Unilateral primary osteoarthritis, right hip: Secondary | ICD-10-CM | POA: Diagnosis not present

## 2019-01-15 DIAGNOSIS — R2689 Other abnormalities of gait and mobility: Secondary | ICD-10-CM | POA: Diagnosis not present

## 2019-01-15 DIAGNOSIS — M5135 Other intervertebral disc degeneration, thoracolumbar region: Secondary | ICD-10-CM | POA: Diagnosis not present

## 2019-01-15 DIAGNOSIS — M6281 Muscle weakness (generalized): Secondary | ICD-10-CM | POA: Diagnosis not present

## 2019-01-20 DIAGNOSIS — M25552 Pain in left hip: Secondary | ICD-10-CM | POA: Diagnosis not present

## 2019-01-20 DIAGNOSIS — R2689 Other abnormalities of gait and mobility: Secondary | ICD-10-CM | POA: Diagnosis not present

## 2019-01-20 DIAGNOSIS — M1611 Unilateral primary osteoarthritis, right hip: Secondary | ICD-10-CM | POA: Diagnosis not present

## 2019-01-20 DIAGNOSIS — M6281 Muscle weakness (generalized): Secondary | ICD-10-CM | POA: Diagnosis not present

## 2019-01-20 DIAGNOSIS — M5135 Other intervertebral disc degeneration, thoracolumbar region: Secondary | ICD-10-CM | POA: Diagnosis not present

## 2019-01-22 DIAGNOSIS — M1611 Unilateral primary osteoarthritis, right hip: Secondary | ICD-10-CM | POA: Diagnosis not present

## 2019-01-22 DIAGNOSIS — M6281 Muscle weakness (generalized): Secondary | ICD-10-CM | POA: Diagnosis not present

## 2019-01-22 DIAGNOSIS — M25552 Pain in left hip: Secondary | ICD-10-CM | POA: Diagnosis not present

## 2019-01-22 DIAGNOSIS — R2689 Other abnormalities of gait and mobility: Secondary | ICD-10-CM | POA: Diagnosis not present

## 2019-01-22 DIAGNOSIS — M5135 Other intervertebral disc degeneration, thoracolumbar region: Secondary | ICD-10-CM | POA: Diagnosis not present

## 2019-01-25 DIAGNOSIS — Z79899 Other long term (current) drug therapy: Secondary | ICD-10-CM | POA: Diagnosis not present

## 2019-01-25 DIAGNOSIS — M25552 Pain in left hip: Secondary | ICD-10-CM | POA: Diagnosis not present

## 2019-01-25 DIAGNOSIS — R5383 Other fatigue: Secondary | ICD-10-CM | POA: Diagnosis not present

## 2019-01-25 DIAGNOSIS — E6609 Other obesity due to excess calories: Secondary | ICD-10-CM | POA: Diagnosis not present

## 2019-01-25 DIAGNOSIS — Z6830 Body mass index (BMI) 30.0-30.9, adult: Secondary | ICD-10-CM | POA: Diagnosis not present

## 2019-01-26 DIAGNOSIS — M545 Low back pain: Secondary | ICD-10-CM | POA: Diagnosis not present

## 2019-01-26 DIAGNOSIS — I10 Essential (primary) hypertension: Secondary | ICD-10-CM | POA: Diagnosis not present

## 2019-01-26 DIAGNOSIS — M150 Primary generalized (osteo)arthritis: Secondary | ICD-10-CM | POA: Diagnosis not present

## 2019-01-27 DIAGNOSIS — M6281 Muscle weakness (generalized): Secondary | ICD-10-CM | POA: Diagnosis not present

## 2019-01-27 DIAGNOSIS — M5135 Other intervertebral disc degeneration, thoracolumbar region: Secondary | ICD-10-CM | POA: Diagnosis not present

## 2019-01-27 DIAGNOSIS — M1611 Unilateral primary osteoarthritis, right hip: Secondary | ICD-10-CM | POA: Diagnosis not present

## 2019-01-27 DIAGNOSIS — M25552 Pain in left hip: Secondary | ICD-10-CM | POA: Diagnosis not present

## 2019-01-27 DIAGNOSIS — R2689 Other abnormalities of gait and mobility: Secondary | ICD-10-CM | POA: Diagnosis not present

## 2019-02-03 DIAGNOSIS — M6281 Muscle weakness (generalized): Secondary | ICD-10-CM | POA: Diagnosis not present

## 2019-02-03 DIAGNOSIS — R2689 Other abnormalities of gait and mobility: Secondary | ICD-10-CM | POA: Diagnosis not present

## 2019-02-03 DIAGNOSIS — M25552 Pain in left hip: Secondary | ICD-10-CM | POA: Diagnosis not present

## 2019-02-03 DIAGNOSIS — M1611 Unilateral primary osteoarthritis, right hip: Secondary | ICD-10-CM | POA: Diagnosis not present

## 2019-02-03 DIAGNOSIS — M5135 Other intervertebral disc degeneration, thoracolumbar region: Secondary | ICD-10-CM | POA: Diagnosis not present

## 2019-02-05 DIAGNOSIS — M5135 Other intervertebral disc degeneration, thoracolumbar region: Secondary | ICD-10-CM | POA: Diagnosis not present

## 2019-02-05 DIAGNOSIS — M6281 Muscle weakness (generalized): Secondary | ICD-10-CM | POA: Diagnosis not present

## 2019-02-05 DIAGNOSIS — R2689 Other abnormalities of gait and mobility: Secondary | ICD-10-CM | POA: Diagnosis not present

## 2019-02-05 DIAGNOSIS — M25552 Pain in left hip: Secondary | ICD-10-CM | POA: Diagnosis not present

## 2019-02-05 DIAGNOSIS — M1611 Unilateral primary osteoarthritis, right hip: Secondary | ICD-10-CM | POA: Diagnosis not present

## 2019-02-11 DIAGNOSIS — M5135 Other intervertebral disc degeneration, thoracolumbar region: Secondary | ICD-10-CM | POA: Diagnosis not present

## 2019-02-11 DIAGNOSIS — M25552 Pain in left hip: Secondary | ICD-10-CM | POA: Diagnosis not present

## 2019-02-11 DIAGNOSIS — M5136 Other intervertebral disc degeneration, lumbar region: Secondary | ICD-10-CM | POA: Diagnosis not present

## 2019-02-11 DIAGNOSIS — M47816 Spondylosis without myelopathy or radiculopathy, lumbar region: Secondary | ICD-10-CM | POA: Diagnosis not present

## 2019-02-11 DIAGNOSIS — M1612 Unilateral primary osteoarthritis, left hip: Secondary | ICD-10-CM | POA: Diagnosis not present

## 2019-02-15 DIAGNOSIS — M25552 Pain in left hip: Secondary | ICD-10-CM | POA: Diagnosis not present

## 2019-02-15 DIAGNOSIS — M25559 Pain in unspecified hip: Secondary | ICD-10-CM | POA: Diagnosis not present

## 2019-02-23 DIAGNOSIS — M150 Primary generalized (osteo)arthritis: Secondary | ICD-10-CM | POA: Diagnosis not present

## 2019-02-23 DIAGNOSIS — I10 Essential (primary) hypertension: Secondary | ICD-10-CM | POA: Diagnosis not present

## 2019-02-23 DIAGNOSIS — M545 Low back pain: Secondary | ICD-10-CM | POA: Diagnosis not present

## 2019-02-25 DIAGNOSIS — M1612 Unilateral primary osteoarthritis, left hip: Secondary | ICD-10-CM | POA: Diagnosis not present

## 2019-03-22 DIAGNOSIS — Z6830 Body mass index (BMI) 30.0-30.9, adult: Secondary | ICD-10-CM | POA: Diagnosis not present

## 2019-03-22 DIAGNOSIS — L278 Dermatitis due to other substances taken internally: Secondary | ICD-10-CM | POA: Diagnosis not present

## 2019-03-22 DIAGNOSIS — I10 Essential (primary) hypertension: Secondary | ICD-10-CM | POA: Diagnosis not present

## 2019-03-22 DIAGNOSIS — M545 Low back pain: Secondary | ICD-10-CM | POA: Diagnosis not present

## 2019-03-22 DIAGNOSIS — K210 Gastro-esophageal reflux disease with esophagitis: Secondary | ICD-10-CM | POA: Diagnosis not present

## 2019-03-23 DIAGNOSIS — M545 Low back pain: Secondary | ICD-10-CM | POA: Diagnosis not present

## 2019-03-23 DIAGNOSIS — K210 Gastro-esophageal reflux disease with esophagitis: Secondary | ICD-10-CM | POA: Diagnosis not present

## 2019-03-23 DIAGNOSIS — I10 Essential (primary) hypertension: Secondary | ICD-10-CM | POA: Diagnosis not present

## 2019-04-06 DIAGNOSIS — M4316 Spondylolisthesis, lumbar region: Secondary | ICD-10-CM | POA: Diagnosis not present

## 2019-04-06 DIAGNOSIS — Z6829 Body mass index (BMI) 29.0-29.9, adult: Secondary | ICD-10-CM | POA: Diagnosis not present

## 2019-04-06 DIAGNOSIS — M5442 Lumbago with sciatica, left side: Secondary | ICD-10-CM | POA: Diagnosis not present

## 2019-04-06 DIAGNOSIS — M25552 Pain in left hip: Secondary | ICD-10-CM | POA: Diagnosis not present

## 2019-04-19 DIAGNOSIS — M4697 Unspecified inflammatory spondylopathy, lumbosacral region: Secondary | ICD-10-CM | POA: Diagnosis not present

## 2019-04-19 DIAGNOSIS — M4316 Spondylolisthesis, lumbar region: Secondary | ICD-10-CM | POA: Diagnosis not present

## 2019-04-19 DIAGNOSIS — S73102A Unspecified sprain of left hip, initial encounter: Secondary | ICD-10-CM | POA: Diagnosis not present

## 2019-04-19 DIAGNOSIS — M4317 Spondylolisthesis, lumbosacral region: Secondary | ICD-10-CM | POA: Diagnosis not present

## 2019-04-19 DIAGNOSIS — M25552 Pain in left hip: Secondary | ICD-10-CM | POA: Diagnosis not present

## 2019-04-19 DIAGNOSIS — M4826 Kissing spine, lumbar region: Secondary | ICD-10-CM | POA: Diagnosis not present

## 2019-04-19 DIAGNOSIS — M47816 Spondylosis without myelopathy or radiculopathy, lumbar region: Secondary | ICD-10-CM | POA: Diagnosis not present

## 2019-04-20 DIAGNOSIS — M545 Low back pain: Secondary | ICD-10-CM | POA: Diagnosis not present

## 2019-04-20 DIAGNOSIS — K210 Gastro-esophageal reflux disease with esophagitis: Secondary | ICD-10-CM | POA: Diagnosis not present

## 2019-04-20 DIAGNOSIS — I10 Essential (primary) hypertension: Secondary | ICD-10-CM | POA: Diagnosis not present

## 2019-04-25 DIAGNOSIS — M25512 Pain in left shoulder: Secondary | ICD-10-CM | POA: Diagnosis not present

## 2019-04-25 DIAGNOSIS — Z86718 Personal history of other venous thrombosis and embolism: Secondary | ICD-10-CM | POA: Diagnosis not present

## 2019-04-25 DIAGNOSIS — S199XXA Unspecified injury of neck, initial encounter: Secondary | ICD-10-CM | POA: Diagnosis not present

## 2019-04-25 DIAGNOSIS — S0182XA Laceration with foreign body of other part of head, initial encounter: Secondary | ICD-10-CM | POA: Diagnosis not present

## 2019-04-25 DIAGNOSIS — M25552 Pain in left hip: Secondary | ICD-10-CM | POA: Diagnosis not present

## 2019-04-25 DIAGNOSIS — S0181XA Laceration without foreign body of other part of head, initial encounter: Secondary | ICD-10-CM | POA: Diagnosis not present

## 2019-04-25 DIAGNOSIS — I10 Essential (primary) hypertension: Secondary | ICD-10-CM | POA: Diagnosis not present

## 2019-04-25 DIAGNOSIS — Z86711 Personal history of pulmonary embolism: Secondary | ICD-10-CM | POA: Diagnosis not present

## 2019-04-25 DIAGNOSIS — W19XXXA Unspecified fall, initial encounter: Secondary | ICD-10-CM | POA: Diagnosis not present

## 2019-04-25 DIAGNOSIS — S0990XA Unspecified injury of head, initial encounter: Secondary | ICD-10-CM | POA: Diagnosis not present

## 2019-04-29 ENCOUNTER — Encounter: Payer: Self-pay | Admitting: Orthopaedic Surgery

## 2019-04-29 ENCOUNTER — Ambulatory Visit (INDEPENDENT_AMBULATORY_CARE_PROVIDER_SITE_OTHER): Payer: Medicare PPO

## 2019-04-29 ENCOUNTER — Other Ambulatory Visit: Payer: Self-pay

## 2019-04-29 ENCOUNTER — Ambulatory Visit (INDEPENDENT_AMBULATORY_CARE_PROVIDER_SITE_OTHER): Payer: Medicare PPO | Admitting: Orthopaedic Surgery

## 2019-04-29 VITALS — Ht 62.0 in | Wt 165.0 lb

## 2019-04-29 DIAGNOSIS — M05752 Rheumatoid arthritis with rheumatoid factor of left hip without organ or systems involvement: Secondary | ICD-10-CM | POA: Diagnosis not present

## 2019-04-29 DIAGNOSIS — M542 Cervicalgia: Secondary | ICD-10-CM

## 2019-04-29 NOTE — Progress Notes (Signed)
Office Visit Note   Patient: Kimberly Wood           Date of Birth: 14-Nov-1935           MRN: [id number] Visit Date: 04/29/2019              Requested by: No referring provider defined for this encounter. PCP: Neale Burly, MD   Assessment & Plan: Visit Diagnoses:  1. Neck pain   2. 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  She has bone-on-bone changes in her left hip with rheumatoid arthritis and marrow edema in the femoral head.  She will require left total hip arthroplasty for her severe pain in order to improve her ambulation.  I would recommend Dr. Sherrie Sport refer her to a rheumatologist to consider starting additional treatment for her multi-joint rheumatoid arthritis with some of the newer autoimmune medicines.  She would require preoperative medical clearance by him.  We discussed using a direct anterior approach for left total hip arthroplasty likely 2 night stay in the hospital with her multi-joint involvement.  Risks of surgery discussed.  Questions were elicited and answered.  She understands and requests we proceed.  Follow-Up Instructions: pre-op for left THA direct anterior approach.   Orders:  Orders Placed This Encounter  Procedures  . XR Cervical Spine 2 or 3 views   No orders of the defined types were placed in this encounter.     Procedures: No procedures performed   Clinical Data: No additional findings.   Subjective: Chief Complaint  Patient presents with  . Lower Back - Pain  . Left Hip - Pain    HPI 83 year old female with diagnosis of rheumatoid arthritis referred by Dr.Hasanaj for severe back pain great difficulty ambulating and tingling with weakness in her legs with standing or with walking.  She is on diclofenac also takes tramadol for pain for rheumatoid arthritis with significant deformity in her  MCPs and fingers.  Her ability to ambulate is significantly decreased in the last 6 months.  3 years ago she had left L4-5 cyst removal .  With weightbearing she has had left groin pain that radiates down to her mid thigh region.  She gets relief with sitting position.  She not able to sleep on her left side.  Pain wakes her up at night.  She has been Dealer with a walker.  X-rays 02/11/2019 shows bone-on-bone left hip changes which have progressed since February x-rays.  Lumbar MRI 04/19/2019 showed some facet effusion degenerative changes.  No high-grade foraminal or central canal stenosis.  MRI left Hip results impression as follows.   Review of Systems Patient's had previous right total knee arthroplasty.  History of DVT postop x2 with pulmonary embolism history.  Rheumatoid arthritis x10 years she has not been on Biologics.  Posture appendectomy tonsillectomy and hemorrhoidectomy in the past in the 70s.  Hysterectomy 72.  Cholecystectomy 2016.  Positive for acid reflux anxiety depression hypertension migraines.  Objective: Vital Signs: Ht 5\' 2"  (1.575 m)   Wt 165 lb (74.8 kg)   BMI 30.18 kg/m   Physical Exam Constitutional:      Appearance: She is well-developed.  HENT:     Head: Normocephalic.     Right Ear: External ear normal.     Left Ear: External ear normal.  Eyes:     Pupils: Pupils  are equal, round, and reactive to light.  Neck:     Thyroid: No thyromegaly.     Trachea: No tracheal deviation.  Cardiovascular:     Rate and Rhythm: Normal rate.  Pulmonary:     Effort: Pulmonary effort is normal.  Abdominal:     Palpations: Abdomen is soft.  Skin:    General: Skin is warm and dry.  Neurological:     Mental Status: She is alert and oriented to person, place, and time.  Psychiatric:        Behavior: Behavior normal.     Ortho Exam patient ambulates with a left Trendelenburg gait short stride with minimal knee flexion slow gait sequence.  Balance is fair.  She has ulnar  drift MCPs without rheumatoid nodules at the wrist or elbow.  Well-healed right total knee arthroplasty incision no calf tenderness.  Specialty Comments:  No specialty comments available.  Imaging: No results found.   PMFS History: Patient Active Problem List   Diagnosis Date Noted  . Rheumatoid arthritis involving left hip (HCC) 05/01/2019   Past Medical History:  Diagnosis Date  . Anxiety   . Arthritis   . Depression   . GERD (gastroesophageal reflux disease)   . Hypertension   . Migraines   . Pulmonary embolism (HCC)     No family history on file.  Past Surgical History:  Procedure Laterality Date  . ABDOMINAL HYSTERECTOMY  1972  . APPENDECTOMY  1973  . cholecystecomy  2016  . HEMORRHOID SURGERY  1971  . KNEE SURGERY Right 2005  . TONSILLECTOMY  1973   Social History   Occupational History  . Not on file  Tobacco Use  . Smoking status: Never Smoker  . Smokeless tobacco: Never Used  Substance and Sexual Activity  . Alcohol use: Yes  . Drug use: Not on file  . Sexual activity: Not on file

## 2019-05-01 ENCOUNTER — Encounter: Payer: Self-pay | Admitting: Orthopaedic Surgery

## 2019-05-01 DIAGNOSIS — M069 Rheumatoid arthritis, unspecified: Secondary | ICD-10-CM | POA: Insufficient documentation

## 2019-05-12 ENCOUNTER — Other Ambulatory Visit: Payer: Self-pay

## 2019-05-12 ENCOUNTER — Ambulatory Visit: Payer: Medicare PPO | Admitting: Surgery

## 2019-05-17 NOTE — Pre-Procedure Instructions (Signed)
[facility] [address] [place] [address] Phone: [phone] Fax: [phone]      Your procedure is scheduled on 05-21-19 Friday  Report to [facility] Main Entrance "A" at 0800  A.M., and check in at the Admitting office.  Call this number if you have problems the morning of surgery:  [phone]  Call [phone] if you have any questions prior to your surgery date Monday-Friday 8am-4pm    Remember:  Do not eat or drink after midnight the night before your surgery  Take these medicines the morning of surgery with A SIP OF WATER: traMADol (ULTRAM) pravastatin (PRAVACHOL) omeprazole (PRILOSEC) metoprolol succinate (TOPROL-XL) ALPRAZolam (XANAX) buPROPion (WELLBUTRIN SR) gabapentin (NEURONTIN) ISOPTO TEARS / GONIOVISC as needed 7 days prior to surgery STOP taking any Aspirin (unless otherwise instructed by your surgeon), Aleve, Naproxen, Ibuprofen, Motrin, Advil, Goody's, BC's, all herbal medications, fish oil, and all vitamins.    The Morning of Surgery  Do not wear jewelry, make-up or nail polish.  Do not wear lotions, powders, or perfumes/colognes, or deodorant  Do not shave 48 hours prior to surgery.  Men may shave face and neck.  Do not bring valuables to the hospital.  [facility] is not responsible for any belongings or valuables.  If you are a smoker, DO NOT Smoke 24 hours prior to surgery IF you wear a CPAP at night please bring your mask, tubing, and machine the morning of surgery   Remember that you must have someone to transport you home after your surgery, and remain with you for 24 hours if you are discharged the same day.   Contacts, glasses, hearing aids, dentures or bridgework may not be worn into surgery.    Leave your suitcase in the car.  After surgery it may be brought to your room.  For patients admitted to the hospital, discharge time will be determined by your treatment team.  Patients discharged  the day of surgery will not be allowed to drive home.    Special instructions:   [facility]- Preparing For Surgery  Before surgery, you can play an important role. Because skin is not sterile, your skin needs to be as free of germs as possible. You can reduce the number of germs on your skin by washing with CHG (chlorahexidine gluconate) Soap before surgery.  CHG is an antiseptic cleaner which kills germs and bonds with the skin to continue killing germs even after washing.    Oral Hygiene is also important to reduce your risk of infection.  Remember - BRUSH YOUR TEETH THE MORNING OF SURGERY WITH YOUR REGULAR TOOTHPASTE  Please do not use if you have an allergy to CHG or antibacterial soaps. If your skin becomes reddened/irritated stop using the CHG.  Do not shave (including legs and underarms) for at least 48 hours prior to first CHG shower. It is OK to shave your face.  Please follow these instructions carefully.   1. Shower the NIGHT BEFORE SURGERY and the MORNING OF SURGERY with CHG Soap.   2. If you chose to wash your hair, wash your hair first as usual with your normal shampoo.  3. After you shampoo, rinse your hair and body thoroughly to remove the shampoo.  4. Use CHG as you would any other liquid soap. You can apply CHG directly to the skin and wash gently with a scrungie or a clean washcloth.   5. Apply the CHG Soap to  your body ONLY FROM THE NECK DOWN.  Do not use on open wounds or open sores. Avoid contact with your eyes, ears, mouth and genitals (private parts). Wash Face and genitals (private parts)  with your normal soap.   6. Wash thoroughly, paying special attention to the area where your surgery will be performed.  7. Thoroughly rinse your body with warm water from the neck down.  8. DO NOT shower/wash with your normal soap after using and rinsing off the CHG Soap.  9. Pat yourself dry with a CLEAN TOWEL.  10. Wear CLEAN PAJAMAS to bed the night before surgery,  wear comfortable clothes the morning of surgery  11. Place CLEAN SHEETS on your bed the night of your first shower and DO NOT SLEEP WITH PETS.  Day of Surgery:  Do not apply any deodorants/lotions. Please shower the morning of surgery with the CHG soap  Please wear clean clothes to the hospital/surgery center.   Remember to brush your teeth WITH YOUR REGULAR TOOTHPASTE.   Please read over the fact sheets that you were given.

## 2019-05-18 ENCOUNTER — Encounter (HOSPITAL_COMMUNITY)
Admission: RE | Admit: 2019-05-18 | Discharge: 2019-05-18 | Disposition: A | Discharge status: Home / Self Care | Payer: Medicare PPO | Source: Ambulatory Visit | Attending: Surgery | Admitting: Surgery

## 2019-05-18 ENCOUNTER — Other Ambulatory Visit (HOSPITAL_COMMUNITY)
Admission: RE | Admit: 2019-05-18 | Discharge: 2019-05-18 | Disposition: A | Discharge status: Home / Self Care | Payer: Medicare PPO | Source: Ambulatory Visit | Attending: Orthopaedic Surgery | Admitting: Orthopaedic Surgery

## 2019-05-18 ENCOUNTER — Encounter (HOSPITAL_COMMUNITY): Payer: Self-pay

## 2019-05-18 ENCOUNTER — Encounter (HOSPITAL_COMMUNITY)
Admission: RE | Admit: 2019-05-18 | Discharge: 2019-05-18 | Disposition: A | Discharge status: Home / Self Care | Payer: Medicare PPO | Source: Ambulatory Visit | Attending: Orthopaedic Surgery | Admitting: Orthopaedic Surgery

## 2019-05-18 ENCOUNTER — Other Ambulatory Visit: Payer: Self-pay

## 2019-05-18 DIAGNOSIS — G43909 Migraine, unspecified, not intractable, without status migrainosus: Secondary | ICD-10-CM | POA: Diagnosis present

## 2019-05-18 DIAGNOSIS — Z1159 Encounter for screening for other viral diseases: Secondary | ICD-10-CM | POA: Diagnosis not present

## 2019-05-18 DIAGNOSIS — Z79899 Other long term (current) drug therapy: Secondary | ICD-10-CM | POA: Insufficient documentation

## 2019-05-18 DIAGNOSIS — Z01818 Encounter for other preprocedural examination: Secondary | ICD-10-CM

## 2019-05-18 DIAGNOSIS — I10 Essential (primary) hypertension: Secondary | ICD-10-CM | POA: Diagnosis present

## 2019-05-18 DIAGNOSIS — Z86718 Personal history of other venous thrombosis and embolism: Secondary | ICD-10-CM | POA: Diagnosis not present

## 2019-05-18 DIAGNOSIS — K219 Gastro-esophageal reflux disease without esophagitis: Secondary | ICD-10-CM | POA: Diagnosis present

## 2019-05-18 DIAGNOSIS — Z90710 Acquired absence of both cervix and uterus: Secondary | ICD-10-CM | POA: Diagnosis not present

## 2019-05-18 DIAGNOSIS — Z86711 Personal history of pulmonary embolism: Secondary | ICD-10-CM | POA: Diagnosis not present

## 2019-05-18 DIAGNOSIS — Z882 Allergy status to sulfonamides status: Secondary | ICD-10-CM | POA: Diagnosis not present

## 2019-05-18 DIAGNOSIS — M069 Rheumatoid arthritis, unspecified: Secondary | ICD-10-CM | POA: Diagnosis present

## 2019-05-18 DIAGNOSIS — Z96642 Presence of left artificial hip joint: Secondary | ICD-10-CM | POA: Diagnosis not present

## 2019-05-18 DIAGNOSIS — Z87892 Personal history of anaphylaxis: Secondary | ICD-10-CM | POA: Diagnosis not present

## 2019-05-18 DIAGNOSIS — Z96651 Presence of right artificial knee joint: Secondary | ICD-10-CM | POA: Diagnosis present

## 2019-05-18 DIAGNOSIS — Z885 Allergy status to narcotic agent status: Secondary | ICD-10-CM | POA: Diagnosis not present

## 2019-05-18 DIAGNOSIS — Z880 Allergy status to penicillin: Secondary | ICD-10-CM | POA: Diagnosis not present

## 2019-05-18 DIAGNOSIS — Z7901 Long term (current) use of anticoagulants: Secondary | ICD-10-CM | POA: Insufficient documentation

## 2019-05-18 DIAGNOSIS — Z471 Aftercare following joint replacement surgery: Secondary | ICD-10-CM | POA: Diagnosis not present

## 2019-05-18 DIAGNOSIS — Z91018 Allergy to other foods: Secondary | ICD-10-CM | POA: Diagnosis not present

## 2019-05-18 DIAGNOSIS — K449 Diaphragmatic hernia without obstruction or gangrene: Secondary | ICD-10-CM | POA: Diagnosis not present

## 2019-05-18 DIAGNOSIS — F329 Major depressive disorder, single episode, unspecified: Secondary | ICD-10-CM | POA: Diagnosis present

## 2019-05-18 DIAGNOSIS — F419 Anxiety disorder, unspecified: Secondary | ICD-10-CM | POA: Diagnosis present

## 2019-05-18 DIAGNOSIS — M06052 Rheumatoid arthritis without rheumatoid factor, left hip: Secondary | ICD-10-CM | POA: Diagnosis not present

## 2019-05-18 DIAGNOSIS — F418 Other specified anxiety disorders: Secondary | ICD-10-CM | POA: Diagnosis not present

## 2019-05-18 DIAGNOSIS — D62 Acute posthemorrhagic anemia: Secondary | ICD-10-CM | POA: Diagnosis not present

## 2019-05-18 DIAGNOSIS — Z9049 Acquired absence of other specified parts of digestive tract: Secondary | ICD-10-CM | POA: Diagnosis not present

## 2019-05-18 DIAGNOSIS — M06852 Other specified rheumatoid arthritis, left hip: Secondary | ICD-10-CM | POA: Diagnosis not present

## 2019-05-18 HISTORY — DX: Other specified postprocedural states: R11.2

## 2019-05-18 HISTORY — DX: Other specified postprocedural states: Z98.890

## 2019-05-18 HISTORY — DX: Other complications of anesthesia, initial encounter: T88.59XA

## 2019-05-18 LAB — URINALYSIS, ROUTINE W REFLEX MICROSCOPIC
Bilirubin Urine: NEGATIVE
Glucose, UA: NEGATIVE mg/dL
Hgb urine dipstick: NEGATIVE
Ketones, ur: 15 mg/dL — AB
Nitrite: NEGATIVE
Protein, ur: NEGATIVE mg/dL
Specific Gravity, Urine: 1.015 (ref 1.005–1.030)
pH: 6 (ref 5.0–8.0)

## 2019-05-18 LAB — CBC
HCT: 40.3 % (ref 36.0–46.0)
Hemoglobin: 13.3 g/dL (ref 12.0–15.0)
MCH: 32 pg (ref 26.0–34.0)
MCHC: 33 g/dL (ref 30.0–36.0)
MCV: 96.9 fL (ref 80.0–100.0)
Platelets: 175 10*3/uL (ref 150–400)
RBC: 4.16 MIL/uL (ref 3.87–5.11)
RDW: 12.8 % (ref 11.5–15.5)
WBC: 5.6 10*3/uL (ref 4.0–10.5)
nRBC: 0 % (ref 0.0–0.2)

## 2019-05-18 LAB — URINALYSIS, MICROSCOPIC (REFLEX): RBC / HPF: NONE SEEN RBC/hpf (ref 0–5)

## 2019-05-18 LAB — COMPREHENSIVE METABOLIC PANEL
ALT: 22 U/L (ref 0–44)
AST: 24 U/L (ref 15–41)
Albumin: 3.9 g/dL (ref 3.5–5.0)
Alkaline Phosphatase: 50 U/L (ref 38–126)
Anion gap: 10 (ref 5–15)
BUN: 21 mg/dL (ref 8–23)
CO2: 26 mmol/L (ref 22–32)
Calcium: 8.8 mg/dL — ABNORMAL LOW (ref 8.9–10.3)
Chloride: 98 mmol/L (ref 98–111)
Creatinine, Ser: 0.99 mg/dL (ref 0.44–1.00)
GFR calc Af Amer: >60 mL/min (ref >60)
GFR calc non Af Amer: 54 mL/min — ABNORMAL LOW (ref >60)
Glucose, Bld: 92 mg/dL (ref 70–99)
Potassium: 4.2 mmol/L (ref 3.5–5.1)
Sodium: 134 mmol/L — ABNORMAL LOW (ref 135–145)
Total Bilirubin: 0.6 mg/dL (ref 0.3–1.2)
Total Protein: 7.3 g/dL (ref 6.5–8.1)

## 2019-05-18 LAB — APTT: aPTT: 36 seconds (ref 24–36)

## 2019-05-18 LAB — PROTIME-INR
INR: 1.1 (ref 0.8–1.2)
Prothrombin Time: 14.1 seconds (ref 11.4–15.2)

## 2019-05-18 LAB — SURGICAL PCR SCREEN
MRSA, PCR: POSITIVE — AB
Staphylococcus aureus: POSITIVE — AB

## 2019-05-18 NOTE — Progress Notes (Signed)
PCP - Dr. Angelique Holm, Rhunette Croft.  Cardiologist - Denies  Chest x-ray - 05/18/2019  EKG - 05/18/2019  Stress Test - Denies  ECHO - Denies  Cardiac Cath - Denies  AICD-na PM-na LOOP-na  Sleep Study - Denies CPAP - Denies  LABS- 05/18/2019: CBC, CMP, PT, PTT, UA, PCR, COVID  ASA-Denies  ERAS- Yes- 1 drink given   Anesthesia- No  Pt denies having chest pain, sob, or fever at this time. All instructions explained to the pt, with a verbal understanding of the material. Pt agrees to go over the instructions while at home for a better understanding. Pt also instructed to self quarantine after being tested for COVID-19. The opportunity to ask questions was provided.   Coronavirus Screening  Have you experienced the following symptoms:  Cough yes/no: No Fever (>100.71F)  yes/no: No Runny nose yes/no: No Sore throat yes/no: No Difficulty breathing/shortness of breath  yes/no: No  Have you or a family member traveled in the last 14 days and where? yes/no: No   If the patient indicates "YES" to the above questions, their PAT will be rescheduled to limit the exposure to others and, the surgeon will be notified. THE PATIENT WILL NEED TO BE ASYMPTOMATIC FOR 14 DAYS.   If the patient is not experiencing any of these symptoms, the PAT nurse will instruct them to NOT bring anyone with them to their appointment since they may have these symptoms or traveled as well.   Please remind your patients and families that hospital visitation restrictions are in effect and the importance of the restrictions.

## 2019-05-18 NOTE — Progress Notes (Signed)
[facility] [address], [place] [address] Phone: [phone] Fax: [phone]                 Your procedure is scheduled on 05-21-19 Friday             Report to [facility] Main Entrance "A" at 0800  A.M., and check in at the Admitting office.             Call this number if you have problems the morning of surgery:             [phone]  Call [phone] if you have any questions prior to your surgery date Monday-Friday 8am-4pm              Remember:             Do not eat after midnight on July 16th You may have clear liquids until 3 hours (7:00AM) prior to surgery time. Clear liquids that are allowed: Gatorade, Black coffee only (no cream), Water, Clear Tea, Plain Popsicles, Plain Jell-o only, Juice (non-citric, no pulp), and Carbonated beverages.  Please complete your PRE-SURGERY ENSURE that was provided to you by (7:00AM) the morning of surgery.  Please, if able, drink it in one setting. DO NOT SIP.    Take these medicines the morning of surgery with A SIP OF WATER: traMADol (ULTRAM) pravastatin (PRAVACHOL) omeprazole (PRILOSEC) metoprolol succinate (TOPROL-XL) ALPRAZolam (XANAX) buPROPion (WELLBUTRIN SR) gabapentin (NEURONTIN) ISOPTO TEARS / GONIOVISC as needed  7 days prior to surgery STOP taking any Aspirin (unless otherwise instructed by your surgeon), Aleve, Naproxen, Ibuprofen, Motrin, Advil, Goody's, BC's, all herbal medications, fish oil, and all vitamins.  Special instructions:   [facility]- Preparing For Surgery  Before surgery, you can play an important role. Because skin is not sterile, your skin needs to be as free of germs as possible. You can reduce the number of germs on your skin by washing with CHG (chlorahexidine gluconate) Soap before surgery.  CHG is an antiseptic cleaner which kills germs and bonds with the skin to continue killing germs even after washing.    Please do not use if you have an  allergy to CHG or antibacterial soaps. If your skin becomes reddened/irritated stop using the CHG.  Do not shave (including legs and underarms) for at least 48 hours prior to first CHG shower. It is OK to shave your face.  Please follow these instructions carefully.                                                                                                                               1. Shower the NIGHT BEFORE SURGERY and the MORNING OF SURGERY with CHG Soap.   2. If you chose to wash your hair, wash your hair first as usual with your normal shampoo.  3. After you shampoo, rinse your hair and body thoroughly  to remove the shampoo.  4. Use CHG as you would any other liquid soap. You can apply CHG directly to the skin and wash gently with a scrungie or a clean washcloth.   5. Apply the CHG Soap to your body ONLY FROM THE NECK DOWN.  Do not use on open wounds or open sores. Avoid contact with your eyes, ears, mouth and genitals (private parts). Wash Face and genitals (private parts)  with your normal soap.   6. Wash thoroughly, paying special attention to the area where your surgery will be performed.  7. Thoroughly rinse your body with warm water from the neck down.  8. DO NOT shower/wash with your normal soap after using and rinsing off the CHG Soap.  9. Pat yourself dry with a CLEAN TOWEL.  10. Wear CLEAN PAJAMAS to bed the night before surgery, wear comfortable clothes the morning of surgery  11. Place CLEAN SHEETS on your bed the night of your first shower and DO NOT SLEEP WITH PETS.  Day of Surgery:  Remember to brush your teeth WITH YOUR REGULAR TOOTHPASTE.             Do not wear jewelry, make-up or nail polish.             Do not wear lotions, powders, or perfumes/colognes, or deodorant             Do not shave 48 hours prior to surgery.  Men may shave face and neck.             Do not bring valuables to the hospital.             [facility] is not  responsible for any belongings or valuables.  Remember that you must have someone to transport you home after your surgery, and remain with you for 24 hours if you are discharged the same day.  Contacts, glasses, hearing aids, dentures or bridgework may not be worn into surgery.   For patients admitted to the hospital, discharge time will be determined by your treatment team.  Patients discharged the day of surgery will not be allowed to drive home.   Please wear clean clothes to the hospital/surgery center.    Please read over the fact sheets that you were given.

## 2019-05-18 NOTE — Progress Notes (Signed)
Left a message for Tarry Kos., from Dr. Lorin Mercy' office informing her the pcr result +MRSA.

## 2019-05-19 ENCOUNTER — Telehealth: Payer: Self-pay | Admitting: Orthopaedic Surgery

## 2019-05-19 LAB — SARS CORONAVIRUS 2 (TAT 6-24 HRS): SARS Coronavirus 2: NEGATIVE

## 2019-05-19 NOTE — Telephone Encounter (Signed)
pls advise. Thanks.  

## 2019-05-19 NOTE — Telephone Encounter (Signed)
I called discussed.  

## 2019-05-19 NOTE — Telephone Encounter (Signed)
Patient's son Kimberly Wood called left voicemail message to have someone call him since no one can be with his mother tomorrow. Don said patient is scheduled for surgery at 10:00am. Timmothy Sours said patient has some issues when she had the last surgery.  The number to contact Timmothy Sours is [phone]  After 5:00pm [phone]

## 2019-05-21 ENCOUNTER — Ambulatory Visit (HOSPITAL_COMMUNITY): Payer: Medicare PPO | Admitting: Anesthesiology

## 2019-05-21 ENCOUNTER — Encounter (HOSPITAL_COMMUNITY): Payer: Self-pay

## 2019-05-21 ENCOUNTER — Inpatient Hospital Stay (HOSPITAL_COMMUNITY): Payer: Medicare PPO

## 2019-05-21 ENCOUNTER — Ambulatory Visit (HOSPITAL_COMMUNITY): Payer: Medicare PPO

## 2019-05-21 ENCOUNTER — Encounter (HOSPITAL_COMMUNITY)
Admission: AD | Disposition: A | Discharge status: Home / Self Care | Payer: Self-pay | Source: Home / Self Care | Attending: Orthopaedic Surgery

## 2019-05-21 ENCOUNTER — Other Ambulatory Visit: Payer: Self-pay

## 2019-05-21 ENCOUNTER — Inpatient Hospital Stay (HOSPITAL_COMMUNITY)
Admission: AD | Admit: 2019-05-21 | Discharge: 2019-05-24 | DRG: 470 | Disposition: A | Discharge status: Home / Self Care | Payer: Medicare PPO | Attending: Orthopaedic Surgery | Admitting: Orthopaedic Surgery

## 2019-05-21 DIAGNOSIS — Z1159 Encounter for screening for other viral diseases: Secondary | ICD-10-CM

## 2019-05-21 DIAGNOSIS — Z90710 Acquired absence of both cervix and uterus: Secondary | ICD-10-CM | POA: Diagnosis not present

## 2019-05-21 DIAGNOSIS — Z86718 Personal history of other venous thrombosis and embolism: Secondary | ICD-10-CM | POA: Diagnosis not present

## 2019-05-21 DIAGNOSIS — Z86711 Personal history of pulmonary embolism: Secondary | ICD-10-CM | POA: Diagnosis not present

## 2019-05-21 DIAGNOSIS — Z885 Allergy status to narcotic agent status: Secondary | ICD-10-CM | POA: Diagnosis not present

## 2019-05-21 DIAGNOSIS — Z880 Allergy status to penicillin: Secondary | ICD-10-CM

## 2019-05-21 DIAGNOSIS — Z87892 Personal history of anaphylaxis: Secondary | ICD-10-CM | POA: Diagnosis not present

## 2019-05-21 DIAGNOSIS — Z882 Allergy status to sulfonamides status: Secondary | ICD-10-CM | POA: Diagnosis not present

## 2019-05-21 DIAGNOSIS — G43909 Migraine, unspecified, not intractable, without status migrainosus: Secondary | ICD-10-CM | POA: Diagnosis present

## 2019-05-21 DIAGNOSIS — M069 Rheumatoid arthritis, unspecified: Principal | ICD-10-CM | POA: Diagnosis present

## 2019-05-21 DIAGNOSIS — Z96642 Presence of left artificial hip joint: Secondary | ICD-10-CM | POA: Diagnosis not present

## 2019-05-21 DIAGNOSIS — Z9049 Acquired absence of other specified parts of digestive tract: Secondary | ICD-10-CM | POA: Diagnosis not present

## 2019-05-21 DIAGNOSIS — F419 Anxiety disorder, unspecified: Secondary | ICD-10-CM | POA: Diagnosis present

## 2019-05-21 DIAGNOSIS — M06852 Other specified rheumatoid arthritis, left hip: Secondary | ICD-10-CM | POA: Diagnosis not present

## 2019-05-21 DIAGNOSIS — Z91018 Allergy to other foods: Secondary | ICD-10-CM

## 2019-05-21 DIAGNOSIS — F329 Major depressive disorder, single episode, unspecified: Secondary | ICD-10-CM | POA: Diagnosis present

## 2019-05-21 DIAGNOSIS — Z09 Encounter for follow-up examination after completed treatment for conditions other than malignant neoplasm: Secondary | ICD-10-CM

## 2019-05-21 DIAGNOSIS — D62 Acute posthemorrhagic anemia: Secondary | ICD-10-CM | POA: Diagnosis not present

## 2019-05-21 DIAGNOSIS — Z419 Encounter for procedure for purposes other than remedying health state, unspecified: Secondary | ICD-10-CM

## 2019-05-21 DIAGNOSIS — F418 Other specified anxiety disorders: Secondary | ICD-10-CM | POA: Diagnosis not present

## 2019-05-21 DIAGNOSIS — K219 Gastro-esophageal reflux disease without esophagitis: Secondary | ICD-10-CM | POA: Diagnosis present

## 2019-05-21 DIAGNOSIS — M1612 Unilateral primary osteoarthritis, left hip: Secondary | ICD-10-CM | POA: Diagnosis present

## 2019-05-21 DIAGNOSIS — M06052 Rheumatoid arthritis without rheumatoid factor, left hip: Secondary | ICD-10-CM | POA: Diagnosis not present

## 2019-05-21 DIAGNOSIS — Z96651 Presence of right artificial knee joint: Secondary | ICD-10-CM | POA: Diagnosis present

## 2019-05-21 DIAGNOSIS — I10 Essential (primary) hypertension: Secondary | ICD-10-CM | POA: Diagnosis present

## 2019-05-21 DIAGNOSIS — Z471 Aftercare following joint replacement surgery: Secondary | ICD-10-CM | POA: Diagnosis not present

## 2019-05-21 HISTORY — PX: TOTAL HIP ARTHROPLASTY: SHX124

## 2019-05-21 SURGERY — ARTHROPLASTY, HIP, TOTAL, ANTERIOR APPROACH
Anesthesia: Spinal | Site: Hip | Laterality: Left

## 2019-05-21 MED ORDER — ACETAMINOPHEN 325 MG PO TABS
325.0000 mg | ORAL_TABLET | Freq: Four times a day (QID) | ORAL | Status: DC | PRN
  Administered 2019-05-23: 10:00:00 325 mg via ORAL
  Filled 2019-05-21: qty 1

## 2019-05-21 MED ORDER — MUPIROCIN 2 % EX OINT
1.0000 "application " | TOPICAL_OINTMENT | Freq: Two times a day (BID) | CUTANEOUS | Status: DC
  Administered 2019-05-21 – 2019-05-24 (×6): 1 via NASAL
  Filled 2019-05-21: qty 22

## 2019-05-21 MED ORDER — DOCUSATE SODIUM 100 MG PO CAPS
100.0000 mg | ORAL_CAPSULE | Freq: Two times a day (BID) | ORAL | Status: DC
  Administered 2019-05-21 – 2019-05-24 (×5): 100 mg via ORAL
  Filled 2019-05-21 (×7): qty 1

## 2019-05-21 MED ORDER — ONDANSETRON HCL 4 MG/2ML IJ SOLN
4.0000 mg | Freq: Four times a day (QID) | INTRAMUSCULAR | Status: DC | PRN
  Administered 2019-05-22: 16:00:00 4 mg via INTRAVENOUS
  Filled 2019-05-21 (×2): qty 2

## 2019-05-21 MED ORDER — STERILE WATER FOR IRRIGATION IR SOLN
Status: DC | PRN
  Administered 2019-05-21: 1000 mL

## 2019-05-21 MED ORDER — 0.9 % SODIUM CHLORIDE (POUR BTL) OPTIME
TOPICAL | Status: DC | PRN
  Administered 2019-05-21: 11:00:00 1000 mL

## 2019-05-21 MED ORDER — GLYCOPYRROLATE 0.2 MG/ML IJ SOLN
INTRAMUSCULAR | Status: DC | PRN
  Administered 2019-05-21: 0.2 mg via INTRAVENOUS

## 2019-05-21 MED ORDER — SODIUM CHLORIDE 0.9 % IV SOLN
INTRAVENOUS | Status: DC | PRN
  Administered 2019-05-21: 25 ug/min via INTRAVENOUS

## 2019-05-21 MED ORDER — FENTANYL CITRATE (PF) 250 MCG/5ML IJ SOLN
INTRAMUSCULAR | Status: DC | PRN
  Administered 2019-05-21: 25 ug via INTRAVENOUS

## 2019-05-21 MED ORDER — DEXAMETHASONE SODIUM PHOSPHATE 10 MG/ML IJ SOLN
INTRAMUSCULAR | Status: DC | PRN
  Administered 2019-05-21: 5 mg via INTRAVENOUS

## 2019-05-21 MED ORDER — ALBUMIN HUMAN 5 % IV SOLN
12.5000 g | Freq: Once | INTRAVENOUS | Status: AC
  Administered 2019-05-21: 13:00:00 12.5 g via INTRAVENOUS

## 2019-05-21 MED ORDER — HYDROCODONE-ACETAMINOPHEN 5-325 MG PO TABS
1.0000 | ORAL_TABLET | Freq: Four times a day (QID) | ORAL | Status: DC | PRN
  Administered 2019-05-21 (×2): 1 via ORAL
  Administered 2019-05-22 (×2): 2 via ORAL
  Administered 2019-05-22: 1 via ORAL
  Administered 2019-05-22 – 2019-05-24 (×6): 2 via ORAL
  Filled 2019-05-21: qty 2
  Filled 2019-05-21: qty 1
  Filled 2019-05-21 (×7): qty 2
  Filled 2019-05-21 (×3): qty 1

## 2019-05-21 MED ORDER — HYPROMELLOSE (GONIOSCOPIC) 2.5 % OP SOLN
1.0000 [drp] | Freq: Three times a day (TID) | OPHTHALMIC | Status: DC | PRN
  Filled 2019-05-21: qty 15

## 2019-05-21 MED ORDER — PROPOFOL 500 MG/50ML IV EMUL
INTRAVENOUS | Status: DC | PRN
  Administered 2019-05-21: 100 ug/kg/min via INTRAVENOUS

## 2019-05-21 MED ORDER — PHENOL 1.4 % MT LIQD: 1.0000 | OROMUCOSAL | Status: DC | PRN

## 2019-05-21 MED ORDER — ROPINIROLE HCL 0.5 MG PO TABS
0.5000 mg | ORAL_TABLET | Freq: Every day | ORAL | Status: DC
  Administered 2019-05-21 – 2019-05-23 (×3): 0.5 mg via ORAL
  Filled 2019-05-21 (×3): qty 1

## 2019-05-21 MED ORDER — MAGNESIUM OXIDE 400 (241.3 MG) MG PO TABS
200.0000 mg | ORAL_TABLET | Freq: Every day | ORAL | Status: DC
  Administered 2019-05-22 – 2019-05-24 (×3): 200 mg via ORAL
  Filled 2019-05-21 (×3): qty 1

## 2019-05-21 MED ORDER — METOPROLOL SUCCINATE ER 50 MG PO TB24
50.0000 mg | ORAL_TABLET | Freq: Every day | ORAL | Status: DC
  Administered 2019-05-23 – 2019-05-24 (×2): 50 mg via ORAL
  Filled 2019-05-21 (×2): qty 1

## 2019-05-21 MED ORDER — METOCLOPRAMIDE HCL 5 MG/ML IJ SOLN: 5.0000 mg | Freq: Three times a day (TID) | INTRAMUSCULAR | Status: DC | PRN

## 2019-05-21 MED ORDER — FENTANYL CITRATE (PF) 100 MCG/2ML IJ SOLN
INTRAMUSCULAR | Status: AC
  Filled 2019-05-21: qty 2

## 2019-05-21 MED ORDER — PHENYLEPHRINE 40 MCG/ML (10ML) SYRINGE FOR IV PUSH (FOR BLOOD PRESSURE SUPPORT)
PREFILLED_SYRINGE | INTRAVENOUS | Status: DC | PRN
  Administered 2019-05-21: 80 ug via INTRAVENOUS

## 2019-05-21 MED ORDER — METOCLOPRAMIDE HCL 5 MG PO TABS: 5.0000 mg | ORAL_TABLET | Freq: Three times a day (TID) | ORAL | Status: DC | PRN

## 2019-05-21 MED ORDER — GLYCOPYRROLATE PF 0.2 MG/ML IJ SOSY
PREFILLED_SYRINGE | INTRAMUSCULAR | Status: AC
  Filled 2019-05-21: qty 1

## 2019-05-21 MED ORDER — PRAVASTATIN SODIUM 40 MG PO TABS
40.0000 mg | ORAL_TABLET | Freq: Every day | ORAL | Status: DC
  Administered 2019-05-22 – 2019-05-24 (×3): 40 mg via ORAL
  Filled 2019-05-21 (×3): qty 1

## 2019-05-21 MED ORDER — VANCOMYCIN HCL IN DEXTROSE 1-5 GM/200ML-% IV SOLN
1000.0000 mg | Freq: Two times a day (BID) | INTRAVENOUS | Status: AC
  Administered 2019-05-21: 16:00:00 1000 mg via INTRAVENOUS
  Filled 2019-05-21: qty 200

## 2019-05-21 MED ORDER — POTASSIUM CHLORIDE CRYS ER 10 MEQ PO TBCR
10.0000 meq | EXTENDED_RELEASE_TABLET | Freq: Every day | ORAL | Status: DC
  Administered 2019-05-21 – 2019-05-24 (×4): 10 meq via ORAL
  Filled 2019-05-21 (×4): qty 1

## 2019-05-21 MED ORDER — MENTHOL 3 MG MT LOZG: 1.0000 | LOZENGE | OROMUCOSAL | Status: DC | PRN

## 2019-05-21 MED ORDER — ACETAMINOPHEN 500 MG PO TABS
1000.0000 mg | ORAL_TABLET | Freq: Once | ORAL | Status: AC
  Administered 2019-05-21: 09:00:00 1000 mg via ORAL
  Filled 2019-05-21: qty 2

## 2019-05-21 MED ORDER — SODIUM CHLORIDE 0.9 % IV SOLN
INTRAVENOUS | Status: DC
  Administered 2019-05-21 – 2019-05-22 (×2): via INTRAVENOUS

## 2019-05-21 MED ORDER — GABAPENTIN 300 MG PO CAPS
300.0000 mg | ORAL_CAPSULE | Freq: Four times a day (QID) | ORAL | Status: DC
  Administered 2019-05-21 – 2019-05-24 (×11): 300 mg via ORAL
  Filled 2019-05-21 (×11): qty 1

## 2019-05-21 MED ORDER — PANTOPRAZOLE SODIUM 40 MG PO TBEC
40.0000 mg | DELAYED_RELEASE_TABLET | Freq: Every day | ORAL | Status: DC
  Administered 2019-05-22 – 2019-05-24 (×3): 40 mg via ORAL
  Filled 2019-05-21 (×4): qty 1

## 2019-05-21 MED ORDER — CITALOPRAM HYDROBROMIDE 20 MG PO TABS
20.0000 mg | ORAL_TABLET | Freq: Every day | ORAL | Status: DC
  Administered 2019-05-21 – 2019-05-23 (×3): 20 mg via ORAL
  Filled 2019-05-21 (×3): qty 1

## 2019-05-21 MED ORDER — HYDROCODONE-ACETAMINOPHEN 5-325 MG PO TABS: 1.0000 | ORAL_TABLET | Freq: Four times a day (QID) | ORAL | Status: DC | PRN

## 2019-05-21 MED ORDER — PROPOFOL 10 MG/ML IV BOLUS
INTRAVENOUS | Status: AC
  Filled 2019-05-21: qty 20

## 2019-05-21 MED ORDER — FENTANYL CITRATE (PF) 100 MCG/2ML IJ SOLN
25.0000 ug | INTRAMUSCULAR | Status: DC | PRN
  Administered 2019-05-21: 14:00:00 25 ug via INTRAVENOUS

## 2019-05-21 MED ORDER — ONDANSETRON HCL 4 MG PO TABS
4.0000 mg | ORAL_TABLET | Freq: Four times a day (QID) | ORAL | Status: DC | PRN
  Administered 2019-05-24: 4 mg via ORAL
  Filled 2019-05-21: qty 1

## 2019-05-21 MED ORDER — CHLORHEXIDINE GLUCONATE 4 % EX LIQD: 60.0000 mL | Freq: Once | CUTANEOUS | Status: DC

## 2019-05-21 MED ORDER — ONDANSETRON HCL 4 MG/2ML IJ SOLN
INTRAMUSCULAR | Status: AC
  Filled 2019-05-21: qty 2

## 2019-05-21 MED ORDER — ONDANSETRON HCL 4 MG/2ML IJ SOLN
INTRAMUSCULAR | Status: DC | PRN
  Administered 2019-05-21: 4 mg via INTRAVENOUS

## 2019-05-21 MED ORDER — FENTANYL CITRATE (PF) 250 MCG/5ML IJ SOLN
INTRAMUSCULAR | Status: AC
  Filled 2019-05-21: qty 5

## 2019-05-21 MED ORDER — MAGNESIUM 250 MG PO TABS: 250.0000 mg | ORAL_TABLET | Freq: Every day | ORAL | Status: DC

## 2019-05-21 MED ORDER — DEXAMETHASONE SODIUM PHOSPHATE 10 MG/ML IJ SOLN
INTRAMUSCULAR | Status: AC
  Filled 2019-05-21: qty 1

## 2019-05-21 MED ORDER — VANCOMYCIN HCL IN DEXTROSE 1-5 GM/200ML-% IV SOLN
1000.0000 mg | INTRAVENOUS | Status: AC
  Administered 2019-05-21: 09:00:00 1000 mg via INTRAVENOUS

## 2019-05-21 MED ORDER — PROPOFOL 10 MG/ML IV BOLUS
INTRAVENOUS | Status: DC | PRN
  Administered 2019-05-21: 20 mg via INTRAVENOUS
  Administered 2019-05-21: 10 mg via INTRAVENOUS

## 2019-05-21 MED ORDER — BUPROPION HCL ER (SR) 100 MG PO TB12
100.0000 mg | ORAL_TABLET | Freq: Every day | ORAL | Status: DC
  Administered 2019-05-22 – 2019-05-24 (×3): 100 mg via ORAL
  Filled 2019-05-21 (×5): qty 1

## 2019-05-21 MED ORDER — PROMETHAZINE HCL 25 MG/ML IJ SOLN: 6.2500 mg | INTRAMUSCULAR | Status: DC | PRN

## 2019-05-21 MED ORDER — VANCOMYCIN HCL IN DEXTROSE 1-5 GM/200ML-% IV SOLN
INTRAVENOUS | Status: AC
  Administered 2019-05-21: 09:00:00 1000 mg via INTRAVENOUS
  Filled 2019-05-21: qty 200

## 2019-05-21 MED ORDER — ALPRAZOLAM 0.5 MG PO TABS
0.5000 mg | ORAL_TABLET | Freq: Two times a day (BID) | ORAL | Status: DC | PRN
  Administered 2019-05-21 – 2019-05-23 (×3): 0.5 mg via ORAL
  Filled 2019-05-21 (×3): qty 1

## 2019-05-21 MED ORDER — RIVAROXABAN 10 MG PO TABS
10.0000 mg | ORAL_TABLET | Freq: Every day | ORAL | Status: DC
  Administered 2019-05-22 – 2019-05-24 (×3): 10 mg via ORAL
  Filled 2019-05-21 (×3): qty 1

## 2019-05-21 MED ORDER — LACTATED RINGERS IV SOLN
INTRAVENOUS | Status: DC
  Administered 2019-05-21 (×2): via INTRAVENOUS

## 2019-05-21 SURGICAL SUPPLY — 61 items
BENZOIN TINCTURE PRP APPL 2/3 (GAUZE/BANDAGES/DRESSINGS) ×3 IMPLANT
BLADE CLIPPER SURG (BLADE) IMPLANT
BLADE SAW SGTL 18X1.27X75 (BLADE) ×2 IMPLANT
BLADE SAW SGTL 18X1.27X75MM (BLADE) ×1
CELLS DAT CNTRL 66122 CELL SVR (MISCELLANEOUS) ×2 IMPLANT
CLOSURE STERI-STRIP 1/2X4 (GAUZE/BANDAGES/DRESSINGS) ×1
CLOSURE WOUND 1/2 X4 (GAUZE/BANDAGES/DRESSINGS) ×1
CLSR STERI-STRIP ANTIMIC 1/2X4 (GAUZE/BANDAGES/DRESSINGS) ×1 IMPLANT
COVER SURGICAL LIGHT HANDLE (MISCELLANEOUS) ×3 IMPLANT
COVER WAND RF STERILE (DRAPES) ×3 IMPLANT
CUP PINN GRIPTON 54 100 (Cup) ×2 IMPLANT
DRAPE C-ARM 42X72 X-RAY (DRAPES) ×3 IMPLANT
DRAPE IMP U-DRAPE 54X76 (DRAPES) ×3 IMPLANT
DRAPE STERI IOBAN 125X83 (DRAPES) ×3 IMPLANT
DRAPE U-SHAPE 47X51 STRL (DRAPES) ×9 IMPLANT
DRSG MEPILEX BORDER 4X12 (GAUZE/BANDAGES/DRESSINGS) ×2 IMPLANT
DRSG MEPILEX BORDER 4X8 (GAUZE/BANDAGES/DRESSINGS) ×3 IMPLANT
DURAPREP 26ML APPLICATOR (WOUND CARE) ×3 IMPLANT
ELECT BLADE 4.0 EZ CLEAN MEGAD (MISCELLANEOUS)
ELECT CAUTERY BLADE 6.4 (BLADE) ×3 IMPLANT
ELECT REM PT RETURN 9FT ADLT (ELECTROSURGICAL) ×3
ELECTRODE BLDE 4.0 EZ CLN MEGD (MISCELLANEOUS) IMPLANT
ELECTRODE REM PT RTRN 9FT ADLT (ELECTROSURGICAL) ×1 IMPLANT
ELIMINATOR HOLE APEX DEPUY (Hips) ×2 IMPLANT
FACESHIELD WRAPAROUND (MASK) ×6 IMPLANT
FACESHIELD WRAPAROUND OR TEAM (MASK) ×2 IMPLANT
GLOVE BIOGEL PI IND STRL 8 (GLOVE) ×2 IMPLANT
GLOVE BIOGEL PI INDICATOR 8 (GLOVE) ×4
GLOVE ORTHO TXT STRL SZ7.5 (GLOVE) ×6 IMPLANT
GOWN STRL REUS W/ TWL LRG LVL3 (GOWN DISPOSABLE) ×1 IMPLANT
GOWN STRL REUS W/ TWL XL LVL3 (GOWN DISPOSABLE) ×1 IMPLANT
GOWN STRL REUS W/TWL 2XL LVL3 (GOWN DISPOSABLE) ×3 IMPLANT
GOWN STRL REUS W/TWL LRG LVL3 (GOWN DISPOSABLE) ×2
GOWN STRL REUS W/TWL XL LVL3 (GOWN DISPOSABLE) ×2
HEAD M SROM 36MM PLUS 1.5 (Hips) IMPLANT
KIT BASIN OR (CUSTOM PROCEDURE TRAY) ×3 IMPLANT
KIT TURNOVER KIT B (KITS) ×3 IMPLANT
LINER NEUTRAL 54X36MM PLUS 4 (Hips) ×2 IMPLANT
MANIFOLD NEPTUNE II (INSTRUMENTS) ×3 IMPLANT
NS IRRIG 1000ML POUR BTL (IV SOLUTION) ×3 IMPLANT
PACK TOTAL JOINT (CUSTOM PROCEDURE TRAY) ×3 IMPLANT
PAD ARMBOARD 7.5X6 YLW CONV (MISCELLANEOUS) ×6 IMPLANT
RETRACTOR WND ALEXIS 18 MED (MISCELLANEOUS) ×1 IMPLANT
RTRCTR WOUND ALEXIS 18CM MED (MISCELLANEOUS) ×6
SROM M HEAD 36MM PLUS 1.5 (Hips) ×3 IMPLANT
STAPLER VISISTAT 35W (STAPLE) ×2 IMPLANT
STEM FEMORAL SZ 5MM STD ACTIS (Stem) ×2 IMPLANT
STRIP CLOSURE SKIN 1/2X4 (GAUZE/BANDAGES/DRESSINGS) ×2 IMPLANT
SUT VIC AB 0 CT1 27 (SUTURE) ×2
SUT VIC AB 0 CT1 27XBRD ANBCTR (SUTURE) ×1 IMPLANT
SUT VIC AB 2-0 CT1 27 (SUTURE) ×2
SUT VIC AB 2-0 CT1 TAPERPNT 27 (SUTURE) ×1 IMPLANT
SUT VICRYL 4-0 PS2 18IN ABS (SUTURE) ×3 IMPLANT
SUT VLOC 180 0 24IN GS25 (SUTURE) ×3 IMPLANT
TOWEL GREEN STERILE (TOWEL DISPOSABLE) ×6 IMPLANT
TOWEL GREEN STERILE FF (TOWEL DISPOSABLE) ×3 IMPLANT
TRAY CATH 16FR W/PLASTIC CATH (SET/KITS/TRAYS/PACK) IMPLANT
TRAY FOLEY MTR SLVR 16FR STAT (SET/KITS/TRAYS/PACK) IMPLANT
TUBE CONNECTING 12'X1/4 (SUCTIONS) ×1
TUBE CONNECTING 12X1/4 (SUCTIONS) ×1 IMPLANT
WATER STERILE IRR 1000ML POUR (IV SOLUTION) ×6 IMPLANT

## 2019-05-21 NOTE — Anesthesia Preprocedure Evaluation (Addendum)
Anesthesia Evaluation  Patient identified by MRN, date of birth, ID band Patient awake    Reviewed: Allergy & Precautions, NPO status , Patient's Chart, lab work & pertinent test results, reviewed documented beta blocker date and time   History of Anesthesia Complications (+) PONV and history of anesthetic complications  Airway Mallampati: II  TM Distance: >3 FB Neck ROM: Full    Dental no notable dental hx. (+) Dental Advisory Given   Pulmonary neg pulmonary ROS,    Pulmonary exam normal        Cardiovascular hypertension, Pt. on home beta blockers negative cardio ROS Normal cardiovascular exam     Neuro/Psych PSYCHIATRIC DISORDERS Anxiety Depression negative neurological ROS     GI/Hepatic Neg liver ROS, GERD  ,  Endo/Other  negative endocrine ROS  Renal/GU negative Renal ROS  negative genitourinary   Musculoskeletal negative musculoskeletal ROS (+)   Abdominal   Peds negative pediatric ROS (+)  Hematology negative hematology ROS (+)   Anesthesia Other Findings   Reproductive/Obstetrics negative OB ROS                            Anesthesia Physical Anesthesia Plan  ASA: III  Anesthesia Plan: Spinal   Post-op Pain Management:    Induction: Intravenous  PONV Risk Score and Plan: 3 and Ondansetron, Dexamethasone and Diphenhydramine  Airway Management Planned: Natural Airway  Additional Equipment:   Intra-op Plan:   Post-operative Plan:   Informed Consent: I have reviewed the patients History and Physical, chart, labs and discussed the procedure including the risks, benefits and alternatives for the proposed anesthesia with the patient or authorized representative who has indicated his/her understanding and acceptance.     Dental advisory given  Plan Discussed with: Anesthesiologist and CRNA  Anesthesia Plan Comments:        Anesthesia Quick Evaluation

## 2019-05-21 NOTE — Op Note (Signed)
Preop diagnosis: Left hip rheumatoid arthritis  Postop diagnosis: Same  Procedure: Left total hip arthroplasty, direct anterior approach  Surgeon: Rodell Perna, MD  Anesthesia: Spinal  Assistant: Benjiman Core, PA-C medically necessary and present for the entire procedure  Implants:  Depuy  gripton 54 cup 100.  Apex hole eliminator.  +4 neutral liner 54 x 36 mm.  Size 5 Actis stem.  36 mm metal ball +1.5 mm neck length.  Procedure: After induction of spinal anesthesia patient placed in boots Foley catheter was placed she was placed on the Hana table C-arm was brought in with good visualization.  1015 drapes were applied.  Belly had been taped over.  DuraPrep was used large shower curtain Betadine Steri-Drape half sheet across the top half across the opposite side timeout procedure completed.  Sterile skin marker had been used before Betadine Steri-Drape application.  Vancomycin was given due to patient's positive PCR.  Oblique incision was made 2 cm lateral to centimeters inferior to the ASIS obliquely down to the trochanter.  Patient had thick layer of adipose tissue which was divided.  Skin protector was placed after some undermining fascia was nicked extended elevated and dull cobra was placed over the top of the capsule.  Transverse bleeders were coagulated capsule was opened gush of clear fluid was present there was erosion of the head and rheumatoid synovium present.  Dull cobra placed above the neck after anterior capsule resection and neck was divided under C-arm visualization completing the superior aspect with an osteotome to protect the greater trochanter.  Neck cut looked good.  Labrum was excised.  Reaming progressing to 53 reamer and 54 no hole Depuy Gripton cup was placed checking under fluoroscopy position for before final impaction.  It had to be continually pounded to seat securely.  Apex illuminator was inserted poly-was inserted dialed appropriately and then secondary impaction  performed cup was flush with the socket was stable did not move and poly-was secure checked with a Freer.  Hydraulic hook was applied leg was taken down and under after being externally rotated to 110.  Posterior capsule was divided.  External rotators were saved.  Mueller retractor medially large extra long Homan was placed behind the trochanter.  Rondure large curette cookie-cutter, chili pepper followed by sequential broaching until #5 gave secure fit trial was reduced with the +1.5 trial there was equivalent leg length by fluoroscopy good stability.  Extension 45 degrees external rotation 90 degrees good stability noted no dislocation.  In neutral position there was trace shock.  Trials were removed with the leg down under and permanent stem was inserted was tight and +1.5 ball inserted and impacted hip was reduced carefully with identical findings of stability AP lateral fluoroscopic pictures were taken and final x-ray showing both hips showing restoration of normal leg length.  Transverse bleeders were coagulated 1 extra time copious irrigation and then V lock closure of the fascia ~subtendinous tissue skin staple closure postop dressing and transferred recovery room.  Patient tolerated procedure well.

## 2019-05-21 NOTE — Interval H&P Note (Signed)
History and Physical Interval Note:  05/21/2019 9:13 AM  Kimberly Wood  has presented today for surgery, with the diagnosis of left hip rheumatoid arthritits.  The various methods of treatment have been discussed with the patient and family. After consideration of risks, benefits and other options for treatment, the patient has consented to  Procedure(s): LEFT TOTAL HIP ARTHROPLASTY-DIRECT ANTERIOR APPROACH (Left) as a surgical intervention.  The patient's history has been reviewed, patient examined, no change in status, stable for surgery.  I have reviewed the patient's chart and labs.  Questions were answered to the patient's satisfaction.     Marybelle Killings

## 2019-05-21 NOTE — Anesthesia Postprocedure Evaluation (Signed)
Anesthesia Post Note  Patient: Kimberly Wood  Procedure(s) Performed: LEFT TOTAL HIP ARTHROPLASTY-DIRECT ANTERIOR APPROACH (Left Hip)     Patient location during evaluation: PACU Anesthesia Type: Spinal Level of consciousness: awake and alert Pain management: pain level controlled Vital Signs Assessment: post-procedure vital signs reviewed and stable Respiratory status: spontaneous breathing and respiratory function stable Cardiovascular status: blood pressure returned to baseline and stable Postop Assessment: spinal receding Anesthetic complications: no    Last Vitals:  Vitals:   05/21/19 1321 05/21/19 1330  BP: (!) 150/67 138/70  Pulse: (!) 52 (!) 45  Resp: 16 17  Temp:    SpO2: 100% 100%    Last Pain:  Vitals:   05/21/19 1315  TempSrc:   PainSc: 0-No pain                 Jenniferlynn Saad DANIEL

## 2019-05-21 NOTE — Plan of Care (Signed)

## 2019-05-21 NOTE — Anesthesia Procedure Notes (Signed)
Procedure Name: MAC Date/Time: 05/21/2019 10:20 AM Performed by: Barrington Ellison, CRNA Pre-anesthesia Checklist: Patient identified, Emergency Drugs available, Suction available, Patient being monitored and Timeout performed Patient Re-evaluated:Patient Re-evaluated prior to induction Oxygen Delivery Method: Simple face mask

## 2019-05-21 NOTE — Evaluation (Signed)
Physical Therapy Evaluation Patient Details Name: Kimberly Wood MRN: [id number] DOB: 04-Mar-1936 Today's Date: 05/21/2019   History of Present Illness  Pt is an 83 y/o female s/p L THA, direct anterior approach. PMH includes HTN, migrain, and PE.   Clinical Impression  Pt is s/p surgery above with deficits below. Pt very limited by pain this session and only able to tolerate bed mobility tasks. Pt requiring mod A for bed mobility this session. Reviewed supine HEP. Pt reports she plans to go home with assist from her husband and d/c. Will continue to follow acutely to maximize functional mobility independence and safety.     Follow Up Recommendations Follow surgeon's recommendation for DC plan and follow-up therapies;Supervision/Assistance - 24 hour    Equipment Recommendations  None recommended by PT    Recommendations for Other Services       Precautions / Restrictions Precautions Precautions: Fall Restrictions Weight Bearing Restrictions: Yes LLE Weight Bearing: Weight bearing as tolerated      Mobility  Bed Mobility Overal bed mobility: Needs Assistance Bed Mobility: Supine to Sit;Sit to Supine     Supine to sit: Mod assist Sit to supine: Mod assist   General bed mobility comments: Mod A for LLE and trunk elevation to come to sitting. Pt with increased pain, so further mobility deferred. Mod A for BLE assist to return to supine. Pt very slow to move and required extended time to perform mobility tasks.   Transfers                    Ambulation/Gait                Stairs            Wheelchair Mobility    Modified Rankin (Stroke Patients Only)       Balance Overall balance assessment: Needs assistance Sitting-balance support: No upper extremity supported;Feet supported Sitting balance-Leahy Scale: Fair                                       Pertinent Vitals/Pain Pain Assessment: 0-10 Pain Score: 8  Pain Location: L hip   Pain Descriptors / Indicators: Aching;Operative site guarding Pain Intervention(s): Limited activity within patient's tolerance;Monitored during session;Patient requesting pain meds-RN notified;Repositioned;RN gave pain meds during session    Home Living Family/patient expects to be discharged to:: Private residence Living Arrangements: Spouse/significant other;Children Available Help at Discharge: Family;Available 24 hours/day Type of Home: House Home Access: Stairs to enter Entrance Stairs-Rails: Right;Left;Can reach both Entrance Stairs-Number of Steps: 2 Home Layout: Two level Home Equipment: Walker - 2 wheels      Prior Function Level of Independence: Needs assistance   Gait / Transfers Assistance Needed: Has been only ambulating short household distances with use of RW. Pt always has someone with her when ambulating.   ADL's / Homemaking Assistance Needed: Pt reports husband assists with bathing and dressing.         Hand Dominance        Extremity/Trunk Assessment   Upper Extremity Assessment Upper Extremity Assessment: Defer to OT evaluation    Lower Extremity Assessment Lower Extremity Assessment: LLE deficits/detail LLE Deficits / Details: Deficits consistent with post op pain and weakness. Limited ROM secondary to pain.        Communication   Communication: No difficulties  Cognition Arousal/Alertness: Awake/alert Behavior During Therapy: WFL for tasks assessed/performed Overall  Cognitive Status: Within Functional Limits for tasks assessed                                        General Comments      Exercises Total Joint Exercises Ankle Circles/Pumps: AROM;Both;20 reps;Supine Quad Sets: AROM;Left;10 reps;Supine   Assessment/Plan    PT Assessment Patient needs continued PT services  PT Problem List Decreased strength;Decreased range of motion;Decreased activity tolerance;Decreased balance;Decreased mobility;Decreased knowledge of  use of DME;Pain;Impaired sensation       PT Treatment Interventions Gait training;DME instruction;Stair training;Therapeutic activities;Functional mobility training;Therapeutic exercise;Balance training;Patient/family education    PT Goals (Current goals can be found in the Care Plan section)  Acute Rehab PT Goals Patient Stated Goal: to decrease pain  PT Goal Formulation: With patient Time For Goal Achievement: 06/04/19 Potential to Achieve Goals: Good    Frequency 7X/week   Barriers to discharge        Co-evaluation               AM-PAC PT "6 Clicks" Mobility  Outcome Measure Help needed turning from your back to your side while in a flat bed without using bedrails?: A Lot Help needed moving from lying on your back to sitting on the side of a flat bed without using bedrails?: A Lot Help needed moving to and from a bed to a chair (including a wheelchair)?: A Lot Help needed standing up from a chair using your arms (e.g., wheelchair or bedside chair)?: A Lot Help needed to walk in hospital room?: A Lot Help needed climbing 3-5 steps with a railing? : A Lot 6 Click Score: 12    End of Session   Activity Tolerance: Patient limited by pain Patient left: in bed;with call bell/phone within reach Nurse Communication: Mobility status PT Visit Diagnosis: Difficulty in walking, not elsewhere classified (R26.2);History of falling (Z91.81);Muscle weakness (generalized) (M62.81);Other abnormalities of gait and mobility (R26.89);Unsteadiness on feet (R26.81);Pain Pain - Right/Left: Left Pain - part of body: Hip    Time: 2595-6387 PT Time Calculation (min) (ACUTE ONLY): 32 min   Charges:   PT Evaluation $PT Eval Low Complexity: 1 Low PT Treatments $Therapeutic Activity: 8-22 mins        Leighton Ruff, PT, DPT  Acute Rehabilitation Services  Pager: [phone] Office: [phone]   Rudean Hitt 05/21/2019, 6:16 PM

## 2019-05-21 NOTE — Transfer of Care (Signed)
Immediate Anesthesia Transfer of Care Note  Patient: Kimberly Wood  Procedure(s) Performed: LEFT TOTAL HIP ARTHROPLASTY-DIRECT ANTERIOR APPROACH (Left Hip)  Patient Location: PACU  Anesthesia Type:MAC and Spinal  Level of Consciousness: drowsy  Airway & Oxygen Therapy: Patient Spontanous Breathing and Patient connected to face mask oxygen  Post-op Assessment: Report given to RN  Post vital signs: Reviewed and stable  Last Vitals:  Vitals Value Taken Time  BP 131/65 05/21/19 1218  Temp    Pulse 55 05/21/19 1219  Resp 18 05/21/19 1219  SpO2 100 % 05/21/19 1219  Vitals shown include unvalidated device data.  Last Pain:  Vitals:   05/21/19 0834  TempSrc:   PainSc: 2       Patients Stated Pain Goal: 4 (66/81/59 4707)  Complications: No apparent anesthesia complications

## 2019-05-21 NOTE — H&P (Signed)
 Office Visit Note   Patient: Kimberly Wood           Date of Birth: 11/13/1935           MRN: [id number] Visit Date: 04/29/2019              Requested by: No referring provider defined for this encounter. PCP: Hasanaj, Xaje A, MD   Assessment & Plan: Visit Diagnoses:  1. Neck pain   2. 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  She has bone-on-bone changes in her left hip with rheumatoid arthritis and marrow edema in the femoral head.  She will require left total hip arthroplasty for her severe pain in order to improve her ambulation.  I would recommend Dr. Hasanaj refer her to a rheumatologist to consider starting additional treatment for her multi-joint rheumatoid arthritis with some of the newer autoimmune medicines.  She would require preoperative medical clearance by him.  We discussed using a direct anterior approach for left total hip arthroplasty likely 2 night stay in the hospital with her multi-joint involvement.  Risks of surgery discussed.  Questions were elicited and answered.  She understands and requests we proceed.  Follow-Up Instructions: pre-op for left THA direct anterior approach.   Orders:  Orders Placed This Encounter  Procedures  . XR Cervical Spine 2 or 3 views   No orders of the defined types were placed in this encounter.     Procedures: No procedures performed   Clinical Data: No additional findings.   Subjective: Chief Complaint  Patient presents with  . Lower Back - Pain  . Left Hip - Pain    HPI 83-year-old female with diagnosis of rheumatoid arthritis referred by Dr.Hasanaj for severe back pain great difficulty ambulating and tingling with weakness in her legs with standing or with walking.  She is on diclofenac also takes tramadol for pain for rheumatoid arthritis with significant deformity in her  MCPs and fingers.  Her ability to ambulate is significantly decreased in the last 6 months.  3 years ago she had left L4-5 cyst removal .  With weightbearing she has had left groin pain that radiates down to her mid thigh region.  She gets relief with sitting position.  She not able to sleep on her left side.  Pain wakes her up at night.  She has been amatory with a walker.  X-rays 02/11/2019 shows bone-on-bone left hip changes which have progressed since February x-rays.  Lumbar MRI 04/19/2019 showed some facet effusion degenerative changes.  No high-grade foraminal or central canal stenosis.  MRI left Hip results impression as follows.   Review of Systems Patient's had previous right total knee arthroplasty.  History of DVT postop x2 with pulmonary embolism history.  Rheumatoid arthritis x10 years she has not been on Biologics.  Posture appendectomy tonsillectomy and hemorrhoidectomy in the past in the 70s.  Hysterectomy 72.  Cholecystectomy 2016.  Positive for acid reflux anxiety depression hypertension migraines.  Objective: Vital Signs: Ht 5' 2" (1.575 m)   Wt 165 lb (74.8 kg)   BMI 30.18 kg/m   Physical Exam Constitutional:      Appearance: She is well-developed.  HENT:     Head: Normocephalic.     Right Ear: External ear normal.     Left Ear: External ear normal.  Eyes:     Pupils: Pupils   are equal, round, and reactive to light.  Neck:     Thyroid: No thyromegaly.     Trachea: No tracheal deviation.  Cardiovascular:     Rate and Rhythm: Normal rate.  Pulmonary:     Effort: Pulmonary effort is normal.  Abdominal:     Palpations: Abdomen is soft.  Skin:    General: Skin is warm and dry.  Neurological:     Mental Status: She is alert and oriented to person, place, and time.  Psychiatric:        Behavior: Behavior normal.     Ortho Exam patient ambulates with a left Trendelenburg gait short stride with minimal knee flexion slow gait sequence.  Balance is fair.  She has ulnar  drift MCPs without rheumatoid nodules at the wrist or elbow.  Well-healed right total knee arthroplasty incision no calf tenderness.  Specialty Comments:  No specialty comments available.  Imaging: No results found.   PMFS History: Patient Active Problem List   Diagnosis Date Noted  . Rheumatoid arthritis involving left hip (HCC) 05/01/2019   Past Medical History:  Diagnosis Date  . Anxiety   . Arthritis   . Depression   . GERD (gastroesophageal reflux disease)   . Hypertension   . Migraines   . Pulmonary embolism (HCC)     No family history on file.  Past Surgical History:  Procedure Laterality Date  . ABDOMINAL HYSTERECTOMY  1972  . APPENDECTOMY  1973  . cholecystecomy  2016  . HEMORRHOID SURGERY  1971  . KNEE SURGERY Right 2005  . TONSILLECTOMY  1973   Social History   Occupational History  . Not on file  Tobacco Use  . Smoking status: Never Smoker  . Smokeless tobacco: Never Used  Substance and Sexual Activity  . Alcohol use: Yes  . Drug use: Not on file  . Sexual activity: Not on file       

## 2019-05-21 NOTE — Plan of Care (Signed)

## 2019-05-22 LAB — CBC
HCT: 28.3 % — ABNORMAL LOW (ref 36.0–46.0)
Hemoglobin: 9.6 g/dL — ABNORMAL LOW (ref 12.0–15.0)
MCH: 32.7 pg (ref 26.0–34.0)
MCHC: 33.9 g/dL (ref 30.0–36.0)
MCV: 96.3 fL (ref 80.0–100.0)
Platelets: 129 10*3/uL — ABNORMAL LOW (ref 150–400)
RBC: 2.94 MIL/uL — ABNORMAL LOW (ref 3.87–5.11)
RDW: 12.8 % (ref 11.5–15.5)
WBC: 7.6 10*3/uL (ref 4.0–10.5)
nRBC: 0 % (ref 0.0–0.2)

## 2019-05-22 LAB — BASIC METABOLIC PANEL
Anion gap: 7 (ref 5–15)
BUN: 13 mg/dL (ref 8–23)
CO2: 25 mmol/L (ref 22–32)
Calcium: 8.5 mg/dL — ABNORMAL LOW (ref 8.9–10.3)
Chloride: 103 mmol/L (ref 98–111)
Creatinine, Ser: 0.88 mg/dL (ref 0.44–1.00)
GFR calc Af Amer: >60 mL/min (ref >60)
GFR calc non Af Amer: >60 mL/min (ref >60)
Glucose, Bld: 120 mg/dL — ABNORMAL HIGH (ref 70–99)
Potassium: 4 mmol/L (ref 3.5–5.1)
Sodium: 135 mmol/L (ref 135–145)

## 2019-05-22 NOTE — Discharge Instructions (Addendum)
Information on my medicine - XARELTO® (Rivaroxaban) ° ° °Why was Xarelto® prescribed for you? °Xarelto® was prescribed for you to reduce the risk of blood clots forming after orthopedic surgery. The medical term for these abnormal blood clots is venous thromboembolism (VTE). ° °What do you need to know about xarelto® ? °Take your Xarelto® ONCE DAILY at the same time every day. °You may take it either with or without food. ° °If you have difficulty swallowing the tablet whole, you may crush it and mix in applesauce just prior to taking your dose. ° °Take Xarelto® exactly as prescribed by your doctor and DO NOT stop taking Xarelto® without talking to the doctor who prescribed the medication.  Stopping without other VTE prevention medication to take the place of Xarelto® may increase your risk of developing a clot. ° °After discharge, you should have regular check-up appointments with your healthcare provider that is prescribing your Xarelto®.   ° °What do you do if you miss a dose? °If you miss a dose, take it as soon as you remember on the same day then continue your regularly scheduled once daily regimen the next day. Do not take two doses of Xarelto® on the same day.  ° °Important Safety Information °A possible side effect of Xarelto® is bleeding. You should call your healthcare provider right away if you experience any of the following: °? Bleeding from an injury or your nose that does not stop. °? Unusual colored urine (red or dark brown) or unusual colored stools (red or black). °? Unusual bruising for unknown reasons. °? A serious fall or if you hit your head (even if there is no bleeding). ° °Some medicines may interact with Xarelto® and might increase your risk of bleeding while on Xarelto®. To help avoid this, consult your healthcare provider or pharmacist prior to using any new prescription or non-prescription medications, including herbals, vitamins, non-steroidal anti-inflammatory drugs (NSAIDs) and  supplements. ° °This website has more information on Xarelto®: www.xarelto.com. ° °INSTRUCTIONS AFTER JOINT REPLACEMENT  ° °o Remove items at home which could result in a fall. This includes throw rugs or furniture in walking pathways °o ICE to the affected joint every three hours while awake for 30 minutes at a time, for at least the first 3-5 days, and then as needed for pain and swelling.  Continue to use ice for pain and swelling. You may notice swelling that will progress down to the foot and ankle.  This is normal after surgery.  Elevate your leg when you are not up walking on it.   °o Continue to use the breathing machine you got in the hospital (incentive spirometer) which will help keep your temperature down.  It is common for your temperature to cycle up and down following surgery, especially at night when you are not up moving around and exerting yourself.  The breathing machine keeps your lungs expanded and your temperature down. ° ° °DIET:  As you were doing prior to hospitalization, we recommend a well-balanced diet. ° °DRESSING / WOUND CARE / SHOWERING ° °Keep the surgical dressing until follow up.  The dressing is water proof, so you can shower without any extra covering.  IF THE DRESSING FALLS OFF or the wound gets wet inside, change the dressing with sterile gauze.  Please use good hand washing techniques before changing the dressing.  Do not use any lotions or creams on the incision until instructed by your surgeon.   ° °ACTIVITY ° °o Increase activity   slowly as tolerated, but follow the weight bearing instructions below.   °o No driving for 6 weeks or until further direction given by your physician.  You cannot drive while taking narcotics.  °o No lifting or carrying greater than 10 lbs. until further directed by your surgeon. °o Avoid periods of inactivity such as sitting longer than an hour when not asleep. This helps prevent blood clots.  °o You may return to work once you are authorized by  your doctor.  ° ° ° °WEIGHT BEARING  ° °Weight bearing as tolerated with assist device (walker, cane, etc) as directed, use it as long as suggested by your surgeon or therapist, typically at least 4-6 weeks. ° ° °EXERCISES ° °Results after joint replacement surgery are often greatly improved when you follow the exercise, range of motion and muscle strengthening exercises prescribed by your doctor. Safety measures are also important to protect the joint from further injury. Any time any of these exercises cause you to have increased pain or swelling, decrease what you are doing until you are comfortable again and then slowly increase them. If you have problems or questions, call your caregiver or physical therapist for advice.  ° °Rehabilitation is important following a joint replacement. After just a few days of immobilization, the muscles of the leg can become weakened and shrink (atrophy).  These exercises are designed to build up the tone and strength of the thigh and leg muscles and to improve motion. Often times heat used for twenty to thirty minutes before working out will loosen up your tissues and help with improving the range of motion but do not use heat for the first two weeks following surgery (sometimes heat can increase post-operative swelling).  ° °These exercises can be done on a training (exercise) mat, on the floor, on a table or on a bed. Use whatever works the best and is most comfortable for you.    Use music or television while you are exercising so that the exercises are a pleasant break in your day. This will make your life better with the exercises acting as a break in your routine that you can look forward to.   Perform all exercises about fifteen times, three times per day or as directed.  You should exercise both the operative leg and the other leg as well. ° °Exercises include: °  °• Quad Sets - Tighten up the muscle on the front of the thigh (Quad) and hold for 5-10 seconds.    °• Straight Leg Raises - With your knee straight (if you were given a brace, keep it on), lift the leg to 60 degrees, hold for 3 seconds, and slowly lower the leg.  Perform this exercise against resistance later as your leg gets stronger.  °• Leg Slides: Lying on your back, slowly slide your foot toward your buttocks, bending your knee up off the floor (only go as far as is comfortable). Then slowly slide your foot back down until your leg is flat on the floor again.  °• Angel Wings: Lying on your back spread your legs to the side as far apart as you can without causing discomfort.  °• Hamstring Strength:  Lying on your back, push your heel against the floor with your leg straight by tightening up the muscles of your buttocks.  Repeat, but this time bend your knee to a comfortable angle, and push your heel against the floor.  You may put a pillow under the heel to make   it more comfortable if necessary.  ° °A rehabilitation program following joint replacement surgery can speed recovery and prevent re-injury in the future due to weakened muscles. Contact your doctor or a physical therapist for more information on knee rehabilitation.  ° ° °CONSTIPATION ° °Constipation is defined medically as fewer than three stools per week and severe constipation as less than one stool per week.  Even if you have a regular bowel pattern at home, your normal regimen is likely to be disrupted due to multiple reasons following surgery.  Combination of anesthesia, postoperative narcotics, change in appetite and fluid intake all can affect your bowels.  ° °YOU MUST use at least one of the following options; they are listed in order of increasing strength to get the job done.  They are all available over the counter, and you may need to use some, POSSIBLY even all of these options:   ° °Drink plenty of fluids (prune juice may be helpful) and high fiber foods °Colace 100 mg by mouth twice a day  °Senokot for constipation as directed and as  needed Dulcolax (bisacodyl), take with full glass of water  °Miralax (polyethylene glycol) once or twice a day as needed. ° °If you have tried all these things and are unable to have a bowel movement in the first 3-4 days after surgery call either your surgeon or your primary doctor.   ° °If you experience loose stools or diarrhea, hold the medications until you stool forms back up.  If your symptoms do not get better within 1 week or if they get worse, check with your doctor.  If you experience "the worst abdominal pain ever" or develop nausea or vomiting, please contact the office immediately for further recommendations for treatment. ° ° °ITCHING:  If you experience itching with your medications, try taking only a single pain pill, or even half a pain pill at a time.  You can also use Benadryl over the counter for itching or also to help with sleep.  ° °TED HOSE STOCKINGS:  Use stockings on both legs until for at least 2 weeks or as directed by physician office. They may be removed at night for sleeping. ° °MEDICATIONS:  See your medication summary on the “After Visit Summary” that nursing will review with you.  You may have some home medications which will be placed on hold until you complete the course of blood thinner medication.  It is important for you to complete the blood thinner medication as prescribed. ° °PRECAUTIONS:  If you experience chest pain or shortness of breath - call 911 immediately for transfer to the hospital emergency department.  ° °If you develop a fever greater that 101 F, purulent drainage from wound, increased redness or drainage from wound, foul odor from the wound/dressing, or calf pain - CONTACT YOUR SURGEON.   °                                                °FOLLOW-UP APPOINTMENTS:  If you do not already have a post-op appointment, please call the office for an appointment to be seen by your surgeon.  Guidelines for how soon to be seen are listed in your “After Visit Summary”, but  are typically between 1-4 weeks after surgery. ° °OTHER INSTRUCTIONS:  ° °Knee Replacement:  Do not place pillow under knee, focus   on keeping the knee straight while resting. CPM instructions: 0-90 degrees, 2 hours in the morning, 2 hours in the afternoon, and 2 hours in the evening. Place foam block, curve side up under heel at all times except when in CPM or when walking.  DO NOT modify, tear, cut, or change the foam block in any way. ° °MAKE SURE YOU:  °• Understand these instructions.  °• Get help right away if you are not doing well or get worse.  ° ° °Thank you for letting us be a part of your medical care team.  It is a privilege we respect greatly.  We hope these instructions will help you stay on track for a fast and full recovery!  ° °

## 2019-05-22 NOTE — Plan of Care (Signed)

## 2019-05-22 NOTE — Progress Notes (Signed)
Physical Therapy Treatment Patient Details Name: Kimberly Wood MRN: [id number] DOB: 19-Dec-1935 Today's Date: 05/22/2019    History of Present Illness Pt is an 83 y/o female s/p L THA, direct anterior approach. PMH includes HTN, migrain, and PE.     PT Comments    Pt is making slow progress towards goals. She progress OOB to recliner chair with mod A +2. Pt unable to ambulate this session secondary to nausea and dizziness upon standing. Plan to check ortho static vitals next session. May need to evaluate SNF vs home if pt continues to progress at current pace.     Follow Up Recommendations  Follow surgeon's recommendation for DC plan and follow-up therapies;Supervision/Assistance - 24 hour     Equipment Recommendations  None recommended by PT    Recommendations for Other Services       Precautions / Restrictions Precautions Precautions: Fall Restrictions Weight Bearing Restrictions: Yes LLE Weight Bearing: Weight bearing as tolerated    Mobility  Bed Mobility Overal bed mobility: Needs Assistance Bed Mobility: Supine to Sit;Sit to Supine     Supine to sit: Mod assist     General bed mobility comments: Mod A to elevate trunk. Pt unable to tolerate assist with LEs secondary to pain. She elevated into long sitting and slowly scooted to progress her LEs off EOB. She required frequent pauses secondary to pain.  Transfers Overall transfer level: Needs assistance Equipment used: Rolling walker (2 wheeled) Transfers: Sit to/from [employer] Sit to Stand: Mod assist;+2 physical assistance Stand pivot transfers: Min assist       General transfer comment: mod A to power up from elevated surface. Pt required two attempts to stand. Cues for forward weight shift and hand placement. Once standing pt was able to slowly pivot to recliner chair.   Ambulation/Gait             General Gait Details: unable secondary to dizziness   Stairs              Wheelchair Mobility    Modified Rankin (Stroke Patients Only)       Balance Overall balance assessment: Needs assistance Sitting-balance support: No upper extremity supported;Feet supported Sitting balance-Leahy Scale: Fair                                      Cognition Arousal/Alertness: Awake/alert Behavior During Therapy: WFL for tasks assessed/performed Overall Cognitive Status: Within Functional Limits for tasks assessed                                        Exercises      General Comments General comments (skin integrity, edema, etc.): pt became dizzy and nausous on standing and required steated break. BP 101/59 after sitting for ~2 min.      Pertinent Vitals/Pain Pain Assessment: Faces Faces Pain Scale: Hurts whole lot Pain Location: L hip with movement Pain Descriptors / Indicators: Aching;Operative site guarding Pain Intervention(s): Limited activity within patient's tolerance;Monitored during session;Repositioned    Home Living                      Prior Function            PT Goals (current goals can now be found in the care plan section) Acute Rehab  PT Goals Patient Stated Goal: to decrease pain  PT Goal Formulation: With patient Time For Goal Achievement: 06/04/19 Potential to Achieve Goals: Good Progress towards PT goals: Progressing toward goals    Frequency    7X/week      PT Plan Current plan remains appropriate    Co-evaluation              AM-PAC PT "6 Clicks" Mobility   Outcome Measure  Help needed turning from your back to your side while in a flat bed without using bedrails?: A Lot Help needed moving from lying on your back to sitting on the side of a flat bed without using bedrails?: A Lot Help needed moving to and from a bed to a chair (including a wheelchair)?: A Lot Help needed standing up from a chair using your arms (e.g., wheelchair or bedside chair)?: A Lot Help  needed to walk in hospital room?: A Lot Help needed climbing 3-5 steps with a railing? : A Lot 6 Click Score: 12    End of Session Equipment Utilized During Treatment: Gait belt Activity Tolerance: Patient limited by pain;Treatment limited secondary to medical complications (Comment)(dizzy) Patient left: with call bell/phone within reach;in chair;with chair alarm set Nurse Communication: Mobility status PT Visit Diagnosis: Difficulty in walking, not elsewhere classified (R26.2);History of falling (Z91.81);Muscle weakness (generalized) (M62.81);Other abnormalities of gait and mobility (R26.89);Unsteadiness on feet (R26.81);Pain Pain - Right/Left: Left Pain - part of body: Hip     Time: 1610-9604 PT Time Calculation (min) (ACUTE ONLY): 23 min  Charges:  $Therapeutic Activity: 23-37 mins                     Kallie Locks, [place] Pager [phone] Acute Rehab   Sheral Apley 05/22/2019, 1:35 PM

## 2019-05-22 NOTE — Progress Notes (Addendum)
   Subjective: 1 Day Post-Op Procedure(s) (LRB): LEFT TOTAL HIP ARTHROPLASTY-DIRECT ANTERIOR APPROACH (Left) Patient reports pain as moderate and severe.  juust sat up , had increased pain and back to bed.   Objective: Vital signs in last 24 hours: Temp:  [97 F (36.1 C)-98.8 F (37.1 C)] 98.8 F (37.1 C) (07/18 0418) Pulse Rate:  [45-118] 75 (07/18 0810) Resp:  [10-22] 18 (07/18 0810) BP: (72-157)/(49-86) 113/58 (07/18 0810) SpO2:  [82 %-100 %] 99 % (07/18 0810)  Intake/Output from previous day: 07/17 0701 - 07/18 0700 In: 3119.3 [P.O.:960; I.V.:2118.1; IV Piggyback:41.2] Out: 2150 [Urine:1800; Blood:350] Intake/Output this shift: No intake/output data recorded.  Recent Labs    05/22/19 0519  HGB 9.6*   Recent Labs    05/22/19 0519  WBC 7.6  RBC 2.94*  HCT 28.3*  PLT 129*   Recent Labs    05/22/19 0519  NA 135  K 4.0  CL 103  CO2 25  BUN 13  CREATININE 0.88  GLUCOSE 120*  CALCIUM 8.5*   No results for input(s): LABPT, INR in the last 72 hours.  Neurologically intact Dg C-arm 1-60 Min  Result Date: 05/21/2019 CLINICAL DATA:  Left hip replacement. EXAM: DG C-ARM 61-120 MIN; OPERATIVE LEFT HIP WITH PELVIS FLUOROSCOPY TIME:  24 seconds. COMPARISON:  None. FINDINGS: Two intraoperative fluoroscopic images were obtained of the left hip. The left femoral and acetabular components appear to be well situated. Expected postoperative changes are noted in the surrounding soft tissues. IMPRESSION: Fluoroscopic guidance provided during left total hip arthroplasty. Electronically Signed   By: Marijo Conception M.D.   On: 05/21/2019 11:53   Dg Hip Port Unilat With Pelvis 1v Left  Result Date: 05/21/2019 CLINICAL DATA:  Status post left hip replacement today. EXAM: DG HIP (WITH OR WITHOUT PELVIS) 1V PORT LEFT COMPARISON:  Intraoperative imaging earlier today. FINDINGS: Left total hip arthroplasty is in place. The device is located. No fracture. Surgical staples noted.  IMPRESSION: Status post left hip replacement today.  No acute finding. Electronically Signed   By: Inge Rise M.D.   On: 05/21/2019 13:40   Dg Hip Operative Unilat W Or W/o Pelvis Left  Result Date: 05/21/2019 CLINICAL DATA:  Left hip replacement. EXAM: DG C-ARM 61-120 MIN; OPERATIVE LEFT HIP WITH PELVIS FLUOROSCOPY TIME:  24 seconds. COMPARISON:  None. FINDINGS: Two intraoperative fluoroscopic images were obtained of the left hip. The left femoral and acetabular components appear to be well situated. Expected postoperative changes are noted in the surrounding soft tissues. IMPRESSION: Fluoroscopic guidance provided during left total hip arthroplasty. Electronically Signed   By: Marijo Conception M.D.   On: 05/21/2019 11:53    Assessment/Plan: 1 Day Post-Op Procedure(s) (LRB): LEFT TOTAL HIP ARTHROPLASTY-DIRECT ANTERIOR APPROACH (Left) Up with therapy, has not stood up yet. Likely home Monday. Has rheumatoid arthritis with stiffness and multiple joint aches.   Marybelle Killings 05/22/2019, 9:01 AM

## 2019-05-22 NOTE — Plan of Care (Signed)

## 2019-05-22 NOTE — Progress Notes (Signed)
Occupational Therapy Evaluation Patient Details Name: Kimberly Wood MRN: [id number] DOB: 1936-10-15 Today's Date: 05/22/2019    History of Present Illness Pt is an 83 y/o female s/p L THA, direct anterior approach. PMH includes HTN, migrain, and PE.    Clinical Impression   PTA, pt was living at home with her husband and daughter, pt reports her husband and daughter assisted with ADL/IADL PRN and pt reports she was independent with functional mobility. Pt currently significantly limited this session secondary to nausea and dizziness. Pt performed ADL at bed level. Pt reports arthritis in bilateral hands make it difficult to maintain grasp on items (mainly dishes and cups), pt able to open/close deodorant with increased time and effort. Provided pt with cool washcloth for nausea. Deferred mobility this session. Will continue to follow pt and progress as tolerated. Due to decline in current level of function, pt would benefit from acute OT to address established goals to facilitate safe D/C to venue listed below. At this time, recommend SNF vs home follow-up. Will continue to follow acutely.     Follow Up Recommendations  Follow surgeon's recommendation for DC plan and follow-up therapies;Supervision/Assistance - 24 hour    Equipment Recommendations  3 in 1 bedside commode    Recommendations for Other Services PT consult     Precautions / Restrictions Precautions Precautions: Fall Restrictions Weight Bearing Restrictions: Yes LLE Weight Bearing: Weight bearing as tolerated      Mobility Bed Mobility Overal bed mobility: Needs Assistance Bed Mobility: Rolling Rolling: Mod assist   Supine to sit: Mod assist     General bed mobility comments: pt limited to rolling to reposition in the bed  Transfers Overall transfer level: Needs assistance Equipment used: Rolling walker (2 wheeled) Transfers: Sit to/from [employer] Sit to Stand: Mod assist;+2 physical  assistance Stand pivot transfers: Min assist       General transfer comment: deferred    Balance Overall balance assessment: Needs assistance Sitting-balance support: No upper extremity supported;Feet supported Sitting balance-Leahy Scale: Fair                                     ADL either performed or assessed with clinical judgement   ADL Overall ADL's : Needs assistance/impaired Eating/Feeding: Set up;Sitting   Grooming: Set up;Sitting Grooming Details (indicate cue type and reason): pt able to open deodorant container and wash face                               General ADL Comments: session limited to bed level due to pt reports of nausea and dizziness;     Vision Patient Visual Report: No change from baseline       Perception     Praxis      Pertinent Vitals/Pain Pain Assessment: Faces Faces Pain Scale: Hurts whole lot Pain Location: L hip with movement Pain Descriptors / Indicators: Aching;Operative site guarding Pain Intervention(s): Limited activity within patient's tolerance;Monitored during session;Repositioned     Hand Dominance Right   Extremity/Trunk Assessment     Lower Extremity Assessment LLE Deficits / Details: Deficits consistent with post op pain and weakness. Limited ROM secondary to pain.        Communication Communication Communication: No difficulties   Cognition Arousal/Alertness: Awake/alert Behavior During Therapy: WFL for tasks assessed/performed Overall Cognitive Status: Impaired/Different from baseline Area of  Impairment: Memory                     Memory: Decreased short-term memory         General Comments: pt demonstrated difficulty recalling information requiring increased time;verbalized she has been having increased difficulty with STM   General Comments  pt significantly limited due to nausea and dizziness upon arrival     Upper Extremity Assessment  Exercises   arthritis  in Bilateral hands, pt reports difficulty maintaining grasp on items;pt able to use bilat hands functionally withincreased time and effort for fine motor skills;overall generalized weakness  Shoulder Instructions      Home Living Family/patient expects to be discharged to:: Private residence Living Arrangements: Spouse/significant other;Children Available Help at Discharge: Family;Available 24 hours/day Type of Home: House Home Access: Stairs to enter [employer] of Steps: 2 Entrance Stairs-Rails: Right;Left;Can reach both Home Layout: Two level Alternate Level Stairs-Number of Steps: 13 Alternate Level Stairs-Rails: Left           Home Equipment: Walker - 2 wheels          Prior Functioning/Environment Level of Independence: Needs assistance  Gait / Transfers Assistance Needed: Has been only ambulating short household distances with use of RW. Pt always has someone with her when ambulating.  ADL's / Homemaking Assistance Needed: Pt reports husband assists with all ADL. Pt reports difficulty maintaining grasp on dishes and cups;husband has been assisting with medication management the past few days PTA            OT Problem List: Decreased strength;Decreased range of motion;Decreased activity tolerance;Impaired balance (sitting and/or standing);Decreased safety awareness;Decreased knowledge of precautions;Decreased knowledge of use of DME or AE;Pain;Impaired UE functional use      OT Treatment/Interventions: Self-care/ADL training;Therapeutic exercise;Energy conservation;DME and/or AE instruction;Therapeutic activities;Patient/family education;Balance training    OT Goals(Current goals can be found in the care plan section) Acute Rehab OT Goals Patient Stated Goal: to decrease pain  OT Goal Formulation: With patient Time For Goal Achievement: 06/05/19 Potential to Achieve Goals: Good ADL Goals Pt Will Perform Grooming: with modified  independence;sitting;standing Pt Will Perform Upper Body Dressing: with modified independence;sitting Pt Will Perform Lower Body Dressing: with modified independence;sit to/from stand Pt Will Transfer to Toilet: with modified independence;ambulating Pt Will Perform Tub/Shower Transfer: with modified independence Additional ADL Goal #1: Pt will progress to EOB with modified independence in preparation for ADL.  OT Frequency: Min 2X/week   Barriers to D/C:            Co-evaluation              AM-PAC OT "6 Clicks" Daily Activity     Outcome Measure Help from another person eating meals?: A Little Help from another person taking care of personal grooming?: A Little Help from another person toileting, which includes using toliet, bedpan, or urinal?: Total Help from another person bathing (including washing, rinsing, drying)?: A Lot Help from another person to put on and taking off regular upper body clothing?: A Lot Help from another person to put on and taking off regular lower body clothing?: Total 6 Click Score: 12   End of Session Nurse Communication: Mobility status  Activity Tolerance: Patient limited by pain;Other (comment)(Treatment limited due to nausea) Patient left: in bed;with call bell/phone within reach;with bed alarm set  OT Visit Diagnosis: Unsteadiness on feet (R26.81);Other abnormalities of gait and mobility (R26.89);Muscle weakness (generalized) (M62.81);Pain Pain - Right/Left: Left Pain - part of body:  Leg;Hip                Time: 9507-2257 OT Time Calculation (min): 14 min Charges:  OT General Charges $OT Visit: 1 Visit OT Evaluation $OT Eval Moderate Complexity: [place] OTR/L Acute Rehabilitation Services Office: [place] 05/22/2019, 3:26 PM

## 2019-05-22 NOTE — Progress Notes (Signed)
PT Cancellation Note  Patient Details Name: Kimberly Wood MRN: [id number] DOB: Jul 17, 1936   Cancelled Treatment:    Reason Eval/Treat Not Completed: Medical issues which prohibited therapy. Pt with nausea and dizziness preventing pt's ability to participate in PT this afternoon. Will check back tomorrow.   Benjiman Core, PTA Pager [phone] Acute Rehab   Allena Katz 05/22/2019, 3:42 PM

## 2019-05-23 LAB — CBC
HCT: 27.3 % — ABNORMAL LOW (ref 36.0–46.0)
Hemoglobin: 9.2 g/dL — ABNORMAL LOW (ref 12.0–15.0)
MCH: 32.3 pg (ref 26.0–34.0)
MCHC: 33.7 g/dL (ref 30.0–36.0)
MCV: 95.8 fL (ref 80.0–100.0)
Platelets: 121 10*3/uL — ABNORMAL LOW (ref 150–400)
RBC: 2.85 MIL/uL — ABNORMAL LOW (ref 3.87–5.11)
RDW: 12.7 % (ref 11.5–15.5)
WBC: 8.4 10*3/uL (ref 4.0–10.5)
nRBC: 0 % (ref 0.0–0.2)

## 2019-05-23 MED ORDER — BISACODYL 10 MG RE SUPP
10.0000 mg | Freq: Once | RECTAL | Status: AC
  Administered 2019-05-23: 10:00:00 10 mg via RECTAL
  Filled 2019-05-23: qty 1

## 2019-05-23 NOTE — Progress Notes (Signed)
Occupational Therapy Treatment Patient Details Name: Kimberly Wood MRN: [id number] DOB: 09-05-1936 Today's Date: 05/23/2019    History of present illness Pt is an 83 y/o female s/p L THA, direct anterior approach. PMH includes HTN, migrain, and PE.    OT comments  Pt progressing well towards established OT goals. Providing pt with education on safe LB dressing techniques and tub transfer techniques. Pt performing tub transfer with Min A +2, RW, and shower seat. Provided pt with handout for carry over to home. Pt requiring Min A for LB dressing and donning left foot first; reports she typically uses AE. Feel pt would benefit from dc with HHOT to optimize safety and independence with ADLs. Will continue to follow acutely as admitted.    Follow Up Recommendations  Follow surgeon's recommendation for DC plan and follow-up therapies;Supervision/Assistance - 24 hour;Home health OT    Equipment Recommendations  3 in 1 bedside commode    Recommendations for Other Services PT consult    Precautions / Restrictions Precautions Precautions: Fall Restrictions Weight Bearing Restrictions: Yes LLE Weight Bearing: Weight bearing as tolerated       Mobility Bed Mobility Overal bed mobility: Needs Assistance Bed Mobility: Sit to Supine       Sit to supine: Mod assist   General bed mobility comments: Mod A for bringing BLEs over EOB  Transfers Overall transfer level: Needs assistance Equipment used: Rolling walker (2 wheeled) Transfers: Sit to/from Stand Sit to Stand: Min guard         General transfer comment: [place]ard assist for safety; heavy dependence on UEs to push up; performed several sit<>stands during session    Balance Overall balance assessment: Needs assistance Sitting-balance support: No upper extremity supported;Feet supported Sitting balance-Leahy Scale: Fair       Standing balance-Leahy Scale: Fair                             ADL either performed  or assessed with clinical judgement   ADL Overall ADL's : Needs assistance/impaired                     Lower Body Dressing: Minimal assistance;Sit to/from stand Lower Body Dressing Details (indicate cue type and reason): Educating pt on donning on left LE first. Pt requiring Min A to bring underwear over her foot and reports she typically would use a reacher to optimzie independence Toilet Transfer: Min guard;Ambulation;RW(simulated to recliner)       Tub/ Shower Transfer: Minimal assistance;+2 for safety/equipment;Ambulation;Tub transfer;Rolling walker;Shower Field seismologist Details (indicate cue type and reason): Providing pt with education and handout on tub transfer. Pt requiring Min A to elevate into standing from shower seat. Functional mobility during ADLs: Min guard;Rolling walker General ADL Comments: Pt very motivated to [employer] home     Vision       Perception     Praxis      Cognition Arousal/Alertness: Awake/alert Behavior During Therapy: WFL for tasks assessed/performed Overall Cognitive Status: Within Functional Limits for tasks assessed(for simple mobility tasks)                                          Exercises     Shoulder Instructions       General Comments      Pertinent Vitals/ Pain  Pain Assessment: Faces Faces Pain Scale: Hurts a little bit Pain Location: L hip with movement Pain Descriptors / Indicators: Aching;Operative site guarding Pain Intervention(s): Monitored during session;Limited activity within patient's tolerance;Repositioned  Home Living                     Bathroom Shower/Tub: Tub/shower unit   Bathroom Toilet: Standard(with riser)     Home Equipment: Environmental consultant - 2 wheels;Toilet riser;Grab bars - toilet;Shower seat          Prior Functioning/Environment              Frequency  Min 2X/week        Progress Toward Goals  OT Goals(current goals can now be found in the  care plan section)  Progress towards OT goals: Progressing toward goals  Acute Rehab OT Goals Patient Stated Goal: "you see how motivated I am to get home" OT Goal Formulation: With patient Time For Goal Achievement: 06/05/19 Potential to Achieve Goals: Good ADL Goals Pt Will Perform Grooming: with modified independence;sitting;standing Pt Will Perform Upper Body Dressing: with modified independence;sitting Pt Will Perform Lower Body Dressing: with modified independence;sit to/from stand Pt Will Transfer to Toilet: with modified independence;ambulating Pt Will Perform Tub/Shower Transfer: with modified independence Additional ADL Goal #1: Pt will progress to EOB with modified independence in preparation for ADL.  Plan Discharge plan needs to be updated    Co-evaluation    PT/OT/SLP Co-Evaluation/Treatment: Yes Reason for Co-Treatment: To address functional/ADL transfers;For patient/therapist safety(and aid in decision-making with transition to home) PT goals addressed during session: Mobility/safety with mobility OT goals addressed during session: ADL's and self-care      AM-PAC OT "6 Clicks" Daily Activity     Outcome Measure   Help from another person eating meals?: A Little Help from another person taking care of personal grooming?: A Little Help from another person toileting, which includes using toliet, bedpan, or urinal?: A Little Help from another person bathing (including washing, rinsing, drying)?: A Little Help from another person to put on and taking off regular upper body clothing?: A Little Help from another person to put on and taking off regular lower body clothing?: A Little 6 Click Score: 18    End of Session Equipment Utilized During Treatment: Gait belt;Rolling walker  OT Visit Diagnosis: Unsteadiness on feet (R26.81);Other abnormalities of gait and mobility (R26.89);Muscle weakness (generalized) (M62.81);Pain Pain - Right/Left: Left Pain - part of body:  Leg;Hip   Activity Tolerance Patient tolerated treatment well   Patient Left in bed;with call bell/phone within reach;with bed alarm set   Nurse Communication Mobility status        Time: 6213-0865 OT Time Calculation (min): 55 min  Charges: OT General Charges $OT Visit: 1 Visit OT Treatments $Self Care/Home Management : 23-37 mins  [place], OTR/L Acute Rehab Pager: [phone] Office: [place] 05/23/2019, 4:21 PM

## 2019-05-23 NOTE — Progress Notes (Signed)
Physical Therapy Treatment Patient Details Name: Kimberly Wood MRN: [id number] DOB: August 03, 1936 Today's Date: 05/23/2019    History of Present Illness Pt is an 83 y/o female s/p L THA, direct anterior approach. PMH includes HTN, migrain, and PE.     PT Comments    Continuing work on functional mobility and activity tolerance;  Much improved mobility today compared to yesterday; Lots of encouragement to push herself; at this point, she is still needing physical assist to advance her LLE for stepping;   I would like to see her for another session today, and anticipate the need for 2 PT sessions for more training tomorrow prior to dc   Follow Up Recommendations  Follow surgeon's recommendation for DC plan and follow-up therapies;Supervision/Assistance - 24 hour     Equipment Recommendations  None recommended by PT    Recommendations for Other Services       Precautions / Restrictions Precautions Precautions: Fall Restrictions Weight Bearing Restrictions: Yes LLE Weight Bearing: Weight bearing as tolerated    Mobility  Bed Mobility Overal bed mobility: Needs Assistance Bed Mobility: Sit to Sidelying         Sit to sidelying: Mod assist General bed mobility comments: Cues for technqiue; mod assist to help her LEs into bed; she mentioned that she has a high bed and her husband "picks me up and puts me in the bed"  Transfers Overall transfer level: Needs assistance Equipment used: Rolling walker (2 wheeled) Transfers: Sit to/from Stand Sit to Stand: Mod assist;+2 safety/equipment         General transfer comment: Mod assist to power up and steady; cues for hand placement and safety  Ambulation/Gait Ambulation/Gait assistance: Mod assist;+2 safety/equipment(chair push) Gait Distance (Feet): 25 Feet Assistive device: Rolling walker (2 wheeled) Gait Pattern/deviations: Step-to pattern;Decreased step length - right;Decreased step length - left;Decreased weight shift to  left Gait velocity: very slow   General Gait Details: Cues for gait sequence and to support self on RW; physical assist to advance LLE   Stairs             Wheelchair Mobility    Modified Rankin (Stroke Patients Only)       Balance     Sitting balance-Leahy Scale: Fair       Standing balance-Leahy Scale: Poor(approaching Fair)                              Cognition Arousal/Alertness: Awake/alert Behavior During Therapy: WFL for tasks assessed/performed Overall Cognitive Status: Within Functional Limits for tasks assessed(for simple mobility tasks) Area of Impairment: Memory                                      Exercises Total Joint Exercises Gluteal Sets: AROM;Both;10 reps Heel Slides: AAROM;Left;10 reps Hip ABduction/ADduction: AAROM;Left;10 reps    General Comments General comments (skin integrity, edema, etc.): Much improved from yesterday      Pertinent Vitals/Pain Pain Assessment: Faces Faces Pain Scale: Hurts a little bit Pain Location: L hip with movement Pain Descriptors / Indicators: Aching;Operative site guarding Pain Intervention(s): Monitored during session    Home Living                      Prior Function            PT Goals (current goals can now  be found in the care plan section) Acute Rehab PT Goals Patient Stated Goal: to decrease pain  PT Goal Formulation: With patient Time For Goal Achievement: 06/04/19 Potential to Achieve Goals: Good Progress towards PT goals: Progressing toward goals    Frequency    7X/week      PT Plan Current plan remains appropriate    Co-evaluation              AM-PAC PT "6 Clicks" Mobility   Outcome Measure  Help needed turning from your back to your side while in a flat bed without using bedrails?: A Little Help needed moving from lying on your back to sitting on the side of a flat bed without using bedrails?: A Little Help needed moving to and  from a bed to a chair (including a wheelchair)?: A Little Help needed standing up from a chair using your arms (e.g., wheelchair or bedside chair)?: A Lot Help needed to walk in hospital room?: A Lot Help needed climbing 3-5 steps with a railing? : A Lot 6 Click Score: 15    End of Session Equipment Utilized During Treatment: Gait belt Activity Tolerance: Patient tolerated treatment well Patient left: in bed;with call bell/phone within reach Nurse Communication: Mobility status PT Visit Diagnosis: Difficulty in walking, not elsewhere classified (R26.2);History of falling (Z91.81);Muscle weakness (generalized) (M62.81);Other abnormalities of gait and mobility (R26.89);Unsteadiness on feet (R26.81);Pain Pain - Right/Left: Left Pain - part of body: Hip     Time: 5465-0354 PT Time Calculation (min) (ACUTE ONLY): 38 min  Charges:  $Gait Training: 8-22 mins $Therapeutic Activity: 23-37 mins                     Roney Marion, PT  Acute Rehabilitation Services Pager [phone] Office [place] 05/23/2019, 11:28 AM

## 2019-05-23 NOTE — Progress Notes (Signed)
Subjective: 2 Days Post-Op Procedure(s) (LRB): LEFT TOTAL HIP ARTHROPLASTY-DIRECT ANTERIOR APPROACH (Left) Patient reports pain as moderate.  Feels much better today than yesterday.  Slight lightheadedness.  Acute blood loss anemia from surgery, but stable.  Objective: Vital signs in last 24 hours: Temp:  [98.4 F (36.9 C)-99.2 F (37.3 C)] 98.7 F (37.1 C) (07/19 0323) Pulse Rate:  [75-91] 91 (07/19 0323) Resp:  [14-18] 18 (07/19 0323) BP: (53-118)/(34-87) 106/59 (07/19 0323) SpO2:  [89 %-99 %] 91 % (07/19 0323)  Intake/Output from previous day: 07/18 0701 - 07/19 0700 In: 480 [P.O.:480] Out: 400 [Urine:400] Intake/Output this shift: No intake/output data recorded.  Recent Labs    05/22/19 0519 05/23/19 0454  HGB 9.6* 9.2*   Recent Labs    05/22/19 0519 05/23/19 0454  WBC 7.6 8.4  RBC 2.94* 2.85*  HCT 28.3* 27.3*  PLT 129* 121*   Recent Labs    05/22/19 0519  NA 135  K 4.0  CL 103  CO2 25  BUN 13  CREATININE 0.88  GLUCOSE 120*  CALCIUM 8.5*   No results for input(s): LABPT, INR in the last 72 hours.  Sensation intact distally Intact pulses distally Dorsiflexion/Plantar flexion intact Incision: scant drainage   Assessment/Plan: 2 Days Post-Op Procedure(s) (LRB): LEFT TOTAL HIP ARTHROPLASTY-DIRECT ANTERIOR APPROACH (Left) Up with therapy Plan for discharge tomorrow Discharge home with home health      Mcarthur Rossetti 05/23/2019, 8:09 AM

## 2019-05-23 NOTE — Plan of Care (Signed)

## 2019-05-23 NOTE — Progress Notes (Signed)
Physical Therapy Treatment Patient Details Name: Kimberly Wood MRN: [id number] DOB: 02/21/1936 Today's Date: 05/23/2019    History of Present Illness Pt is an 83 y/o female s/p L THA, direct anterior approach. PMH includes HTN, migrain, and PE.     PT Comments    Continuing work on functional mobility and activity tolerance;  Good progress, incr amb distance and efficiency; Stair training initiated; on track for dc home tomorrow   Follow Up Recommendations  Follow surgeon's recommendation for DC plan and follow-up therapies;Supervision/Assistance - 24 hour     Equipment Recommendations  None recommended by PT    Recommendations for Other Services       Precautions / Restrictions Precautions Precautions: Fall Restrictions LLE Weight Bearing: Weight bearing as tolerated    Mobility  Bed Mobility                  Transfers Overall transfer level: Needs assistance Equipment used: Rolling walker (2 wheeled) Transfers: Sit to/from Stand Sit to Stand: Min guard         General transfer comment: Minguard assist for safety; heavy dependence on UEs to push up; performed several sit<>stands during session  Ambulation/Gait Ambulation/Gait assistance: Min assist;+2 safety/equipment Gait Distance (Feet): 80 Feet Assistive device: Rolling walker (2 wheeled) Gait Pattern/deviations: Step-to pattern;Decreased step length - right;Decreased step length - left;Decreased weight shift to left Gait velocity: slow, but much improved from earlier session today   General Gait Details: Cues for step length, and RW proximity; Short steps initially, but they evened out with practice; her current RW was too tall, provided her with a youth-sized rW to use while in hospital   Stairs Stairs: Yes Stairs assistance: Min guard;Min assist Stair Management: One rail Right;Forwards;Sideways;Step to pattern Number of Stairs: 3 General stair comments: initiated with using R rail and  handheld assist L; then pt noted she prefers to use both hands on R rail; overall managed stairs well; practiced the stairs to enter home today; consider flight tomorrow, if pt wants to practice   Wheelchair Mobility    Modified Rankin (Stroke Patients Only)       Balance     Sitting balance-Leahy Scale: Fair       Standing balance-Leahy Scale: Fair                              Cognition Arousal/Alertness: Awake/alert Behavior During Therapy: WFL for tasks assessed/performed Overall Cognitive Status: Within Functional Limits for tasks assessed(for simple mobility tasks)                                        Exercises      General Comments        Pertinent Vitals/Pain Pain Assessment: Faces Faces Pain Scale: Hurts a little bit Pain Location: L hip with movement Pain Descriptors / Indicators: Aching;Operative site guarding Pain Intervention(s): Monitored during session    Home Living               Home Equipment: Walker - 2 wheels;Toilet riser;Grab bars - toilet;Shower seat      Prior Function            PT Goals (current goals can now be found in the care plan section) Acute Rehab PT Goals Patient Stated Goal: "you see how motivated I am to get home" PT  Goal Formulation: With patient Time For Goal Achievement: 06/04/19 Potential to Achieve Goals: Good Progress towards PT goals: Progressing toward goals    Frequency    7X/week      PT Plan Current plan remains appropriate    Co-evaluation PT/OT/SLP Co-Evaluation/Treatment: Yes Reason for Co-Treatment: To address functional/ADL transfers(and aid in decision-making with transition to home) PT goals addressed during session: Mobility/safety with mobility        AM-PAC PT "6 Clicks" Mobility   Outcome Measure  Help needed turning from your back to your side while in a flat bed without using bedrails?: A Little Help needed moving from lying on your back to  sitting on the side of a flat bed without using bedrails?: A Little Help needed moving to and from a bed to a chair (including a wheelchair)?: A Little Help needed standing up from a chair using your arms (e.g., wheelchair or bedside chair)?: A Little Help needed to walk in hospital room?: A Little Help needed climbing 3-5 steps with a railing? : A Little 6 Click Score: 18    End of Session Equipment Utilized During Treatment: Gait belt Activity Tolerance: Patient tolerated treatment well Patient left: in chair;with call bell/phone within reach;Other (comment)(conitnuing work with OT) Nurse Communication: Mobility status PT Visit Diagnosis: Difficulty in walking, not elsewhere classified (R26.2);History of falling (Z91.81);Muscle weakness (generalized) (M62.81);Other abnormalities of gait and mobility (R26.89);Unsteadiness on feet (R26.81);Pain Pain - Right/Left: Left Pain - part of body: Hip     Time: 1422-1500(Dove-tail with OT) PT Time Calculation (min) (ACUTE ONLY): 38 min  Charges:  $Gait Training: 23-37 mins                     Roney Marion, [place]  [place] Pager [phone] Office [place] 05/23/2019, 4:05 PM

## 2019-05-24 ENCOUNTER — Encounter (HOSPITAL_COMMUNITY): Payer: Self-pay | Admitting: Orthopaedic Surgery

## 2019-05-24 MED ORDER — RIVAROXABAN 10 MG PO TABS: 10.0000 mg | ORAL_TABLET | Freq: Every day | ORAL | 2 refills | Status: DC

## 2019-05-24 MED ORDER — HYDROCODONE-ACETAMINOPHEN 5-325 MG PO TABS: 1.0000 | ORAL_TABLET | Freq: Four times a day (QID) | ORAL | 0 refills | Status: DC | PRN

## 2019-05-24 NOTE — Progress Notes (Signed)
Occupational Therapy Treatment Patient Details Name: Kimberly Wood MRN: [id number] DOB: 1936/05/06 Today's Date: 05/24/2019    History of present illness Pt is an 83 y/o female s/p L THA, direct anterior approach. PMH includes HTN, migrain, and PE.    OT comments  Pt progressing towards OT goals this session. Pt able to get dressed for dc with  Min A for LB (re-educated on sequencing), transfers at min guard with RW. Pt excited about going home today, will have support from husband and daughter. HHOT remains beneficial to ensure safety and maximum independence in ADL and IADL in home environment.    Follow Up Recommendations  Follow surgeon's recommendation for DC plan and follow-up therapies;Supervision/Assistance - 24 hour;Home health OT    Equipment Recommendations  3 in 1 bedside commode    Recommendations for Other Services PT consult    Precautions / Restrictions Precautions Precautions: Fall Restrictions Weight Bearing Restrictions: Yes LLE Weight Bearing: Weight bearing as tolerated       Mobility Bed Mobility               General bed mobility comments: OOB in recliner at beginning and end of session  Transfers Overall transfer level: Needs assistance Equipment used: Rolling walker (2 wheeled) Transfers: Sit to/from Stand Sit to Stand: Min guard              Balance Overall balance assessment: Needs assistance Sitting-balance support: No upper extremity supported;Feet supported Sitting balance-Leahy Scale: Fair       Standing balance-Leahy Scale: Fair                             ADL either performed or assessed with clinical judgement   ADL Overall ADL's : Needs assistance/impaired                 Upper Body Dressing : Set up;Sitting Upper Body Dressing Details (indicate cue type and reason): to don dress and robe Lower Body Dressing: Minimal assistance;Sit to/from stand Lower Body Dressing Details (indicate cue type and  reason): re-Educating pt on sequence for dressing, assist for underwear Toilet Transfer: Min guard;Ambulation;RW(simulated to recliner)   Toileting- Clothing Manipulation and Hygiene: Min guard;Sit to/from stand Toileting - Clothing Manipulation Details (indicate cue type and reason): provided with warm wash cloth     Functional mobility during ADLs: Min guard;Rolling walker General ADL Comments: Pt very motivated to [employer] home     Vision       Perception     Praxis      Cognition Arousal/Alertness: Awake/alert Behavior During Therapy: WFL for tasks assessed/performed Overall Cognitive Status: Within Functional Limits for tasks assessed                                          Exercises     Shoulder Instructions       General Comments      Pertinent Vitals/ Pain       Pain Assessment: Faces Faces Pain Scale: Hurts a little bit Pain Location: L hip with movement Pain Descriptors / Indicators: Aching;Operative site guarding Pain Intervention(s): Limited activity within patient's tolerance;Repositioned;Monitored during session  Home Living  Prior Functioning/Environment              Frequency  Min 2X/week        Progress Toward Goals  OT Goals(current goals can now be found in the care plan section)  Progress towards OT goals: Progressing toward goals  Acute Rehab OT Goals Patient Stated Goal: "you see how motivated I am to get home" OT Goal Formulation: With patient Time For Goal Achievement: 06/05/19 Potential to Achieve Goals: Good  Plan Discharge plan remains appropriate    Co-evaluation    PT/OT/SLP Co-Evaluation/Treatment: Yes            AM-PAC OT "6 Clicks" Daily Activity     Outcome Measure   Help from another person eating meals?: None Help from another person taking care of personal grooming?: A Little Help from another person toileting, which includes  using toliet, bedpan, or urinal?: A Little Help from another person bathing (including washing, rinsing, drying)?: A Little Help from another person to put on and taking off regular upper body clothing?: A Little Help from another person to put on and taking off regular lower body clothing?: A Little 6 Click Score: 19    End of Session Equipment Utilized During Treatment: Gait belt;Rolling walker  OT Visit Diagnosis: Unsteadiness on feet (R26.81);Other abnormalities of gait and mobility (R26.89);Muscle weakness (generalized) (M62.81);Pain Pain - Right/Left: Left Pain - part of body: Leg;Hip   Activity Tolerance Patient tolerated treatment well   Patient Left with call bell/phone within reach;in chair   Nurse Communication Mobility status        Time: 9935-7017 OT Time Calculation (min): 22 min  Charges: OT General Charges $OT Visit: 1 Visit OT Treatments $Self Care/Home Management : 8-22 mins  Hulda Humphrey OTR/L Acute Rehabilitation Services Pager: [phone] Office: [place] 05/24/2019, 2:13 PM

## 2019-05-24 NOTE — Progress Notes (Signed)
   Subjective: 3 Days Post-Op Procedure(s) (LRB): LEFT TOTAL HIP ARTHROPLASTY-DIRECT ANTERIOR APPROACH (Left) Patient reports pain as mild and moderate.    Objective: Vital signs in last 24 hours: Temp:  [98.1 F (36.7 C)-98.8 F (37.1 C)] 98.2 F (36.8 C) (07/20 0826) Pulse Rate:  [77-90] 77 (07/20 0826) Resp:  [17-20] 18 (07/20 0826) BP: (109-123)/(59-86) 109/86 (07/20 0826) SpO2:  [92 %-100 %] 92 % (07/20 0501)  Intake/Output from previous day: 07/19 0701 - 07/20 0700 In: 702 [P.O.:702] Out: 300 [Urine:300] Intake/Output this shift: No intake/output data recorded.  Recent Labs    05/22/19 0519 05/23/19 0454  HGB 9.6* 9.2*   Recent Labs    05/22/19 0519 05/23/19 0454  WBC 7.6 8.4  RBC 2.94* 2.85*  HCT 28.3* 27.3*  PLT 129* 121*   Recent Labs    05/22/19 0519  NA 135  K 4.0  CL 103  CO2 25  BUN 13  CREATININE 0.88  GLUCOSE 120*  CALCIUM 8.5*   No results for input(s): LABPT, INR in the last 72 hours.  Neurologically intact No results found.  Assessment/Plan: 3 Days Post-Op Procedure(s) (LRB): LEFT TOTAL HIP ARTHROPLASTY-DIRECT ANTERIOR APPROACH (Left) Plan:  Did well with therapy, ready for discharge home.  DVT prophylaxis times 3 months due to previous DVT and PE. Office one week. Daily walking. New dressing applied.  Marybelle Killings 05/24/2019, 10:12 AM

## 2019-05-24 NOTE — Addendum Note (Signed)
Addendum  created 05/24/19 1818 by Duane Boston, MD   Child order released for a procedure order, Clinical Note Signed, Intraprocedure Blocks edited

## 2019-05-24 NOTE — Progress Notes (Signed)
PT Cancellation Note  Patient Details Name: Kimberly Wood MRN: [id number] DOB: 09/30/1936   Cancelled Treatment:    Reason Eval/Treat Not Completed: Other (comment).  Pt refused and will anticipate impending discharge.  Pt was encouraged to let nursing know if she changes her mind.   Ramond Dial 05/24/2019, 12:51 PM   Mee Hives, PT MS Acute Rehab Dept. Number: [place]

## 2019-05-24 NOTE — Plan of Care (Signed)

## 2019-05-24 NOTE — Progress Notes (Signed)
Provided discharge education/instructions, all questions and concerns addressed, Pt not in distress, discharged home accompanied by daughter.

## 2019-05-24 NOTE — Anesthesia Procedure Notes (Signed)
Spinal  Patient location during procedure: OR Start time: 05/21/2019 10:17 AM End time: 05/21/2019 10:26 AM Staffing Anesthesiologist: Duane Boston, MD Performed: anesthesiologist  Preanesthetic Checklist Completed: patient identified, surgical consent, pre-op evaluation, timeout performed, IV checked, risks and benefits discussed and monitors and equipment checked Spinal Block Patient position: sitting Prep: DuraPrep Patient monitoring: cardiac monitor, continuous pulse ox and blood pressure Approach: midline Location: L2-3 Injection technique: single-shot Needle Needle type: Pencan  Needle gauge: 24 G Needle length: 9 cm Additional Notes Functioning IV was confirmed and monitors were applied. Sterile prep and drape, including hand hygiene and sterile gloves were used. The patient was positioned and the spine was prepped. The skin was anesthetized with lidocaine.  Free flow of clear CSF was obtained prior to injecting local anesthetic into the CSF.  The spinal needle aspirated freely following injection.  The needle was carefully withdrawn.  The patient tolerated the procedure well.

## 2019-06-01 ENCOUNTER — Ambulatory Visit (INDEPENDENT_AMBULATORY_CARE_PROVIDER_SITE_OTHER): Payer: Medicare PPO

## 2019-06-01 ENCOUNTER — Encounter: Payer: Self-pay | Admitting: Orthopaedic Surgery

## 2019-06-01 ENCOUNTER — Ambulatory Visit (INDEPENDENT_AMBULATORY_CARE_PROVIDER_SITE_OTHER): Payer: Medicare PPO | Admitting: Orthopaedic Surgery

## 2019-06-01 ENCOUNTER — Other Ambulatory Visit: Payer: Self-pay

## 2019-06-01 VITALS — Ht 62.0 in | Wt 170.0 lb

## 2019-06-01 DIAGNOSIS — Z96642 Presence of left artificial hip joint: Secondary | ICD-10-CM

## 2019-06-01 DIAGNOSIS — R05 Cough: Secondary | ICD-10-CM | POA: Diagnosis not present

## 2019-06-01 DIAGNOSIS — R059 Cough, unspecified: Secondary | ICD-10-CM

## 2019-06-01 NOTE — Progress Notes (Signed)
Post-Op Visit Note   Patient: Kimberly Wood           Date of Birth: 1936-03-23           MRN: [id number] Visit Date: 06/01/2019 PCP: Toma Deiters, MD   Assessment & Plan: Patient had some problems with nightmares with the pain medication.  She had been on long-term Ultram.  She stopped the hydrocodone.  She denies pain in her hip.  Dressing is trace drainage.  Chief Complaint:  Chief Complaint  Patient presents with  . Left Hip - Routine Post Op    05/21/2019 Left THA Direct Anterior   Visit Diagnoses:  1. Status post total hip replacement, left   2. Cough   3. Hx of total hip arthroplasty, left     Plan: Chest x-ray was clear patient had problems with a cough and had spinal anesthesia.  She had problems with pain medication Norco giving her nightmares and increased confusion.  She is off that and can use plain Tylenol.  Return 1 week for staple removal and Steri-Strip application.  Follow-Up Instructions: Return in about 1 week (around 06/08/2019).   Orders:  Orders Placed This Encounter  Procedures  . XR HIP UNILAT W OR W/O PELVIS 2-3 VIEWS LEFT  . XR Chest 2 View   No orders of the defined types were placed in this encounter.   Imaging: Xr Chest 2 View  Result Date: 06/01/2019 AP lateral chest x-ray obtained.  Lung fields are clear heart size is unchanged from 05/18/19 images.  Negative for atelectasis or pneumonia.  Hiatal hernia is again noted. Impression: Chest x-ray negative for acute pulmonary process.  Xr Hip Unilat W Or W/o Pelvis 2-3 Views Left  Result Date: 06/01/2019 AP and frog-leg lateral left hip obtained and reviewed this shows good position of the femoral stem.  Acetabular cup may be slightly vertical but does not appear to have change from Intra-Op fluoroscopic pictures and appears to be more projectional. Impression: Satisfactory left total hip arthroplasty.   PMFS History: Patient Active Problem List   Diagnosis Date Noted  . Hx of total hip  arthroplasty, left 06/01/2019  . Arthritis of left hip 05/21/2019  . Rheumatoid arthritis involving left hip (HCC) 05/01/2019   Past Medical History:  Diagnosis Date  . Anxiety   . Arthritis   . Complication of anesthesia    Hard to wake up and hallucinations  . Depression   . GERD (gastroesophageal reflux disease)   . Hypertension   . Migraines   . PONV (postoperative nausea and vomiting)   . Pulmonary embolism (HCC)     No family history on file.  Past Surgical History:  Procedure Laterality Date  . ABDOMINAL HYSTERECTOMY  1972  . APPENDECTOMY  1973  . cholecystecomy  2016  . CHOLECYSTECTOMY    . CYST EXCISION     between 4th & 5th Lumbar  . FINGER AMPUTATION     Right 3rd & 4th  . FOOT SURGERY     Bilateral   . HEMORRHOID SURGERY  1971  . KNEE SURGERY Right 2005  . SHOULDER ARTHROSCOPY     Left  . TONSILLECTOMY  1973  . TOTAL HIP ARTHROPLASTY Left 05/21/2019  . TOTAL HIP ARTHROPLASTY Left 05/21/2019   Procedure: LEFT TOTAL HIP ARTHROPLASTY-DIRECT ANTERIOR APPROACH;  Surgeon: Eldred Manges, MD;  Location: [facility] OR;  Service: Orthopedics;  Laterality: Left;   Social History   Occupational History  . Not on file  Tobacco Use  . Smoking status: Never Smoker  . Smokeless tobacco: Never Used  Substance and Sexual Activity  . Alcohol use: Yes    Comment: occ  . Drug use: Never  . Sexual activity: Not on file

## 2019-06-03 NOTE — Discharge Summary (Signed)
Patient ID: Kimberly Wood MRN: [id number] DOB/AGE: 1936/09/02 83 y.o.  Admit date: 05/21/2019 Discharge date: 06/03/2019  Admission Diagnoses:  Active Problems:   Rheumatoid arthritis involving left hip (HCC)   Arthritis of left hip   Discharge Diagnoses:  Active Problems:   Rheumatoid arthritis involving left hip (HCC)   Arthritis of left hip  status post Procedure(s): LEFT TOTAL HIP ARTHROPLASTY-DIRECT ANTERIOR APPROACH  Past Medical History:  Diagnosis Date  . Anxiety   . Arthritis   . Complication of anesthesia    Hard to wake up and hallucinations  . Depression   . GERD (gastroesophageal reflux disease)   . Hypertension   . Migraines   . PONV (postoperative nausea and vomiting)   . Pulmonary embolism (HCC)     Surgeries: Procedure(s): LEFT TOTAL HIP ARTHROPLASTY-DIRECT ANTERIOR APPROACH on 05/21/2019   Consultants:   Discharged Condition: Improved  Hospital Course: Kimberly Wood is an 83 y.o. female who was admitted 05/21/2019 for operative treatment of hip arthritis. Patient failed conservative treatments (please see the history and physical for the specifics) and had severe unremitting pain that affects sleep, daily activities and work/hobbies. After pre-op clearance, the patient was taken to the operating room on 05/21/2019 and underwent  Procedure(s): LEFT TOTAL HIP ARTHROPLASTY-DIRECT ANTERIOR APPROACH.    Patient was given perioperative antibiotics:  Anti-infectives (From admission, onward)   Start     Dose/Rate Route Frequency Ordered Stop   05/21/19 1500  vancomycin (VANCOCIN) IVPB 1000 mg/200 mL premix     1,000 mg 200 mL/hr over 60 Minutes Intravenous Every 12 hours 05/21/19 1450 05/21/19 1652   05/21/19 0830  vancomycin (VANCOCIN) IVPB 1000 mg/200 mL premix     1,000 mg 200 mL/hr over 60 Minutes Intravenous On call to O.R. 05/21/19 0175 05/21/19 1025       Patient was given sequential compression devices and early ambulation to prevent DVT.    Patient benefited maximally from hospital stay and there were no complications. At the time of discharge, the patient was urinating/moving their bowels without difficulty, tolerating a regular diet, pain is controlled with oral pain medications and they have been cleared by PT/OT.   Recent vital signs: No data found.   Recent laboratory studies: No results for input(s): WBC, HGB, HCT, PLT, NA, K, CL, CO2, BUN, CREATININE, GLUCOSE, INR, CALCIUM in the last 72 hours.  Invalid input(s): PT, 2   Discharge Medications:   Allergies as of 05/24/2019      Reactions   Mushroom Extract Complex Nausea And Vomiting   Mostly Portabella   Penicillins Anaphylaxis, Rash   Did it involve swelling of the face/tongue/throat, SOB, or low BP? Yes Did it involve sudden or severe rash/hives, skin peeling, or any reaction on the inside of your mouth or nose? No Did you need to seek medical attention at a hospital or doctor's office? Yes When did it last happen?Unknown If all above answers are "NO", may proceed with cephalosporin use.   Propofol Other (See Comments)   Headache Lethargy   Sulfamethoxazole Other (See Comments)   Morphine Anxiety   Chest pain       Medication List    STOP taking these medications   traMADol 50 MG tablet Commonly known as: ULTRAM     TAKE these medications   alendronate 70 MG tablet Commonly known as: FOSAMAX Take 70 mg by mouth once a week. Notes to patient: Resume home regimen   ALPRAZolam 0.5 MG tablet Commonly known as:  XANAX Take 0.5 mg by mouth 3 (three) times daily. Notes to patient: Last dose given 07/19 21:07   Biotin 5000 MCG Tabs Take 5,000 mcg by mouth daily. Notes to patient: Resume home regimen   buPROPion 100 MG 12 hr tablet Commonly known as: WELLBUTRIN SR Take 100 mg by mouth daily.   citalopram 20 MG tablet Commonly known as: CELEXA Take 20 mg by mouth at bedtime.   diclofenac 75 MG EC tablet Commonly known as: VOLTAREN Take  75 mg by mouth 2 (two) times daily. Notes to patient: Resume home regimen   gabapentin 300 MG capsule Commonly known as: NEURONTIN Take 300 mg by mouth 4 (four) times daily.   HYDROcodone-acetaminophen 5-325 MG tablet Commonly known as: NORCO/VICODIN Take 1-2 tablets by mouth every 6 (six) hours as needed for moderate pain (pain score 4-6). Post op total hip arthroplasty Notes to patient: Last dose given 07/20 10:10   hydroxypropyl methylcellulose / hypromellose 2.5 % ophthalmic solution Commonly known as: ISOPTO TEARS / GONIOVISC Place 1 drop into both eyes 3 (three) times daily as needed for dry eyes. Notes to patient: Resume home regimen   Magnesium 250 MG Tabs Take 250 mg by mouth daily.   metoprolol succinate 50 MG 24 hr tablet Commonly known as: TOPROL-XL Take 50 mg by mouth daily.   Omega 3 1200 MG Caps Take 1,200 mg by mouth daily. Notes to patient: Resume home regimen   omeprazole 20 MG capsule Commonly known as: PRILOSEC Take 20 mg by mouth daily.   potassium chloride 10 MEQ CR capsule Commonly known as: MICRO-K Take 10 mEq by mouth daily.   pravastatin 40 MG tablet Commonly known as: PRAVACHOL Take 40 mg by mouth daily.   rivaroxaban 10 MG Tabs tablet Commonly known as: XARELTO Take 1 tablet (10 mg total) by mouth daily with breakfast.   rOPINIRole 0.5 MG tablet Commonly known as: REQUIP Take 0.5 mg by mouth at bedtime.       Diagnostic Studies: Dg Chest 2 View  Result Date: 05/18/2019 CLINICAL DATA:  Preop hip replacement surgery. History of hypertension EXAM: CHEST - 2 VIEW COMPARISON:  None. FINDINGS: Heart is normal size. Lungs are clear. No effusions or edema. No acute bony abnormality. Moderate-sized hiatal hernia. IMPRESSION: Hiatal hernia.  No active disease. Electronically Signed   By: Charlett Nose M.D.   On: 05/18/2019 16:56   Dg C-arm 1-60 Min  Result Date: 05/21/2019 CLINICAL DATA:  Left hip replacement. EXAM: DG C-ARM 61-120 MIN;  OPERATIVE LEFT HIP WITH PELVIS FLUOROSCOPY TIME:  24 seconds. COMPARISON:  None. FINDINGS: Two intraoperative fluoroscopic images were obtained of the left hip. The left femoral and acetabular components appear to be well situated. Expected postoperative changes are noted in the surrounding soft tissues. IMPRESSION: Fluoroscopic guidance provided during left total hip arthroplasty. Electronically Signed   By: Lupita Raider M.D.   On: 05/21/2019 11:53   Dg Hip Port Unilat With Pelvis 1v Left  Result Date: 05/21/2019 CLINICAL DATA:  Status post left hip replacement today. EXAM: DG HIP (WITH OR WITHOUT PELVIS) 1V PORT LEFT COMPARISON:  Intraoperative imaging earlier today. FINDINGS: Left total hip arthroplasty is in place. The device is located. No fracture. Surgical staples noted. IMPRESSION: Status post left hip replacement today.  No acute finding. Electronically Signed   By: Drusilla Kanner M.D.   On: 05/21/2019 13:40   Dg Hip Operative Unilat W Or W/o Pelvis Left  Result Date: 05/21/2019 CLINICAL DATA:  Left  hip replacement. EXAM: DG C-ARM 61-120 MIN; OPERATIVE LEFT HIP WITH PELVIS FLUOROSCOPY TIME:  24 seconds. COMPARISON:  None. FINDINGS: Two intraoperative fluoroscopic images were obtained of the left hip. The left femoral and acetabular components appear to be well situated. Expected postoperative changes are noted in the surrounding soft tissues. IMPRESSION: Fluoroscopic guidance provided during left total hip arthroplasty. Electronically Signed   By: Lupita Raider M.D.   On: 05/21/2019 11:53   Xr Chest 2 View  Result Date: 06/01/2019 AP lateral chest x-ray obtained.  Lung fields are clear heart size is unchanged from 05/18/19 images.  Negative for atelectasis or pneumonia.  Hiatal hernia is again noted. Impression: Chest x-ray negative for acute pulmonary process.  Xr Hip Unilat W Or W/o Pelvis 2-3 Views Left  Result Date: 06/01/2019 AP and frog-leg lateral left hip obtained and reviewed  this shows good position of the femoral stem.  Acetabular cup may be slightly vertical but does not appear to have change from Intra-Op fluoroscopic pictures and appears to be more projectional. Impression: Satisfactory left total hip arthroplasty.     Follow-up Information    Eldred Manges, MD Follow up in 1 week(s).   Specialty: Orthopedic Surgery Contact information: [address] [place] [address] [phone]           Discharge Plan:  discharge to home  Disposition:     Signed: Zonia Kief 06/03/2019, 8:47 AM

## 2019-06-08 ENCOUNTER — Ambulatory Visit (INDEPENDENT_AMBULATORY_CARE_PROVIDER_SITE_OTHER): Payer: Medicare PPO | Admitting: Orthopaedic Surgery

## 2019-06-08 ENCOUNTER — Ambulatory Visit: Payer: Self-pay

## 2019-06-08 ENCOUNTER — Encounter: Payer: Self-pay | Admitting: Orthopaedic Surgery

## 2019-06-08 VITALS — Ht 62.0 in | Wt 170.0 lb

## 2019-06-08 DIAGNOSIS — M1712 Unilateral primary osteoarthritis, left knee: Secondary | ICD-10-CM

## 2019-06-08 DIAGNOSIS — M25562 Pain in left knee: Secondary | ICD-10-CM

## 2019-06-08 DIAGNOSIS — Z96642 Presence of left artificial hip joint: Secondary | ICD-10-CM

## 2019-06-08 MED ORDER — METHYLPREDNISOLONE ACETATE 40 MG/ML IJ SUSP
40.0000 mg | INTRAMUSCULAR | Status: AC | PRN
  Administered 2019-06-08: 40 mg via INTRA_ARTICULAR

## 2019-06-08 MED ORDER — LIDOCAINE HCL 1 % IJ SOLN
3.0000 mL | INTRAMUSCULAR | Status: AC | PRN
  Administered 2019-06-08: 3 mL

## 2019-06-08 MED ORDER — BUPIVACAINE HCL 0.25 % IJ SOLN
6.0000 mL | INTRAMUSCULAR | Status: AC | PRN
  Administered 2019-06-08: 6 mL via INTRA_ARTICULAR

## 2019-06-08 NOTE — Progress Notes (Addendum)
Office Visit Note   Patient: Kimberly Wood           Date of Birth: [birth year]           MRN: [id number] Visit Date: 06/08/2019              Requested by: Toma Deiters, MD [address] [place],  [place] [address] PCP: Toma Deiters, MD   Assessment & Plan: Visit Diagnoses:  1. Acute pain of left knee   2. Status post total hip replacement, left     Plan: In hopes of giving patient some relief of her left knee pain offered injection.  Patient sent left knee is prepped with Betadine and intra-articular Marcaine/Depo-Medrol injection was performed.  After sitting for a few minutes patient reported great relief of her knee pain with Marcaine in place.  Ad[facility]  She mainly needs to continue increasing her walking distances.  Do not want her trying to do anything too aggressive too quickly.  Follow-up with Dr. Ophelia Charter in 4 weeks for recheck.  Follow-Up Instructions: Return in about 4 weeks (around 07/06/2019) for with Dr Ophelia Charter.   Orders:  Orders Placed This Encounter  Procedures  . XR KNEE 3 VIEW LEFT   No orders of the defined types were placed in this encounter.     Procedures: Large Joint Inj: L knee on 06/08/2019 3:16 PM Indications: pain Details: 25 G 1.5 in needle, anterolateral approach Medications: 3 mL lidocaine 1 %; 6 mL bupivacaine 0.25 %; 40 mg methylPREDNISolone acetate 40 MG/ML Consent was given by the patient. Patient was prepped and draped in the usual sterile fashion.       Clinical Data: No additional findings.   Subjective: Chief Complaint  Patient presents with  . Left Hip - Follow-up    05/21/2019 Left THA    HPI 83 year old white female who is about 2 weeks status post left total hip replacement returns for wound check.  States that she is doing well.  Progressing well with ambulation.  Some soreness in her thigh.  Patient also complaining of left knee pain and [facility]  She is had right total knee replacement in the past.  Thinks that she may have arthritis in the left knee.   Objective: Vital Signs: Ht 5\' 2"  (1.575 m)   Wt 170 lb (77.1 kg)   BMI 31.09 kg/m   Physical Exam Very pleasant elderly white female alert and oriented in no acute distress.  Left hip wound looks good.  Staples removed and Steri-Strips applied.  No drainage or signs of infection.  Left knee good range of motion.  Some swelling without large effusion.  Joint line tender.  Calf nontender.  Neurovas intact. Ortho Exam  Specialty Comments:  No specialty comments available.  Imaging: No results found.   PMFS History: Patient Active Problem List   Diagnosis Date Noted  . Hx of total hip arthroplasty, left 06/01/2019  . Arthritis of left hip 05/21/2019  . Rheumatoid arthritis involving left hip (HCC) 05/01/2019   Past Medical History:  Diagnosis Date  . Anxiety   . Arthritis   . Complication of anesthesia    Hard to wake up and hallucinations  . Depression   . GERD (gastroesophageal reflux disease)   . Hypertension   . Migraines   . PONV (postoperative  nausea and vomiting)   . Pulmonary embolism ([facility])     History reviewed. No pertinent family history.  Past Surgical History:  Procedure Laterality Date  . ABDOMINAL HYSTERECTOMY  1972  . APPENDECTOMY  1973  . cholecystecomy  2016  . CHOLECYSTECTOMY    . CYST EXCISION     between 4th & 5th Lumbar  . FINGER AMPUTATION     Right 3rd & 4th  . FOOT SURGERY     Bilateral   . HEMORRHOID SURGERY  1971  . KNEE SURGERY Right 2005  . SHOULDER ARTHROSCOPY     Left  . TONSILLECTOMY  1973  . TOTAL HIP ARTHROPLASTY Left 05/21/2019  . TOTAL HIP ARTHROPLASTY Left 05/21/2019   Procedure: LEFT TOTAL HIP ARTHROPLASTY-DIRECT ANTERIOR APPROACH;  Surgeon: Marybelle Killings, MD;  Location: [facility];  Service: Orthopedics;  Laterality: Left;   Social History   Occupational History  . Not on file   Tobacco Use  . Smoking status: Never Smoker  . Smokeless tobacco: Never Used  Substance and Sexual Activity  . Alcohol use: Yes    Comment: occ  . Drug use: Never  . Sexual activity: Not on file

## 2019-06-17 ENCOUNTER — Telehealth: Payer: Self-pay | Admitting: Orthopaedic Surgery

## 2019-06-17 NOTE — Telephone Encounter (Signed)
On Xarelto postop due to history of DVT after other total knee years ago.  She will see her PCP to get electrolytes and CBC checked.  She is not noticed any bleeding problems no melena.  I called and discussed , she is calling her PCP

## 2019-06-17 NOTE — Telephone Encounter (Signed)
Please advise 

## 2019-06-17 NOTE — Telephone Encounter (Signed)
Patient called regarding her medications and symptoms she has had since   taking a blood thinner.  She states her hip is fine, and not feeling any pain in the hip other than a bit of soreness in the scar area.  Patient says she's been walking with no problem, although her left knee has bothered her some even since the cortisone injection.  Her main concerns are tingling in the feet, feeling tired and weak all the time, and not having much of an appetite.  She wants to know if taking the blood thinner is causing this.  She mentioned having 6 or 7 pills left.  She also states she'd like to get back to the gym but has no stamina.  Her next follow up will be at the [place] office on 07-08-19.  Please call and advise.    [phone]

## 2019-06-17 NOTE — Telephone Encounter (Signed)
noted 

## 2019-06-28 DIAGNOSIS — I10 Essential (primary) hypertension: Secondary | ICD-10-CM | POA: Diagnosis not present

## 2019-06-28 DIAGNOSIS — Z6829 Body mass index (BMI) 29.0-29.9, adult: Secondary | ICD-10-CM | POA: Diagnosis not present

## 2019-06-28 DIAGNOSIS — M545 Low back pain: Secondary | ICD-10-CM | POA: Diagnosis not present

## 2019-06-28 DIAGNOSIS — Z0000 Encounter for general adult medical examination without abnormal findings: Secondary | ICD-10-CM | POA: Diagnosis not present

## 2019-06-28 DIAGNOSIS — F411 Generalized anxiety disorder: Secondary | ICD-10-CM | POA: Diagnosis not present

## 2019-06-28 DIAGNOSIS — K210 Gastro-esophageal reflux disease with esophagitis: Secondary | ICD-10-CM | POA: Diagnosis not present

## 2019-07-06 DIAGNOSIS — M545 Low back pain: Secondary | ICD-10-CM | POA: Diagnosis not present

## 2019-07-06 DIAGNOSIS — K210 Gastro-esophageal reflux disease with esophagitis: Secondary | ICD-10-CM | POA: Diagnosis not present

## 2019-07-06 DIAGNOSIS — F411 Generalized anxiety disorder: Secondary | ICD-10-CM | POA: Diagnosis not present

## 2019-07-06 DIAGNOSIS — I10 Essential (primary) hypertension: Secondary | ICD-10-CM | POA: Diagnosis not present

## 2019-07-06 DIAGNOSIS — M4316 Spondylolisthesis, lumbar region: Secondary | ICD-10-CM | POA: Diagnosis not present

## 2019-07-08 ENCOUNTER — Ambulatory Visit: Payer: Medicare PPO | Admitting: Orthopaedic Surgery

## 2019-07-08 ENCOUNTER — Other Ambulatory Visit: Payer: Self-pay

## 2019-07-15 DIAGNOSIS — Z5321 Procedure and treatment not carried out due to patient leaving prior to being seen by health care provider: Secondary | ICD-10-CM | POA: Insufficient documentation

## 2019-07-27 DIAGNOSIS — L309 Dermatitis, unspecified: Secondary | ICD-10-CM | POA: Diagnosis not present

## 2019-07-27 DIAGNOSIS — K5900 Constipation, unspecified: Secondary | ICD-10-CM | POA: Diagnosis not present

## 2019-07-27 DIAGNOSIS — F411 Generalized anxiety disorder: Secondary | ICD-10-CM | POA: Diagnosis not present

## 2019-07-27 DIAGNOSIS — Z6828 Body mass index (BMI) 28.0-28.9, adult: Secondary | ICD-10-CM | POA: Diagnosis not present

## 2019-07-29 ENCOUNTER — Ambulatory Visit (INDEPENDENT_AMBULATORY_CARE_PROVIDER_SITE_OTHER): Payer: Medicare PPO | Admitting: Orthopaedic Surgery

## 2019-07-29 ENCOUNTER — Other Ambulatory Visit: Payer: Self-pay

## 2019-07-29 DIAGNOSIS — Z96642 Presence of left artificial hip joint: Secondary | ICD-10-CM

## 2019-07-29 NOTE — Progress Notes (Signed)
   Post-Op Visit Note   Patient: Kimberly Wood           Date of Birth: [birth year]           MRN: [id number] Visit Date: 07/29/2019 PCP: Neale Burly, MD   Assessment & Plan:  Chief Complaint: No chief complaint on file.  Visit Diagnoses:  1. Hx of total hip arthroplasty, left     Plan: outpt PT for gait work, core and hip strengthening. ROV 2 months. She can start with PT and transition to [facility] where she use to go to exercise.   Follow-Up Instructions: Return in about 2 months (around 09/28/2019).   Orders:  No orders of the defined types were placed in this encounter.  No orders of the defined types were placed in this encounter.   Imaging: No results found.  PMFS History: Patient Active Problem List   Diagnosis Date Noted  . Procedure and treatment not carried out due to patient leaving prior to being seen by health care provider 07/15/2019  . Hx of total hip arthroplasty, left 06/01/2019  . Rheumatoid arthritis involving left hip ([facility]) 05/01/2019   Past Medical History:  Diagnosis Date  . Anxiety   . Arthritis   . Complication of anesthesia    Hard to wake up and hallucinations  . Depression   . GERD (gastroesophageal reflux disease)   . Hypertension   . Migraines   . PONV (postoperative nausea and vomiting)   . Pulmonary embolism ([facility])     No family history on file.  Past Surgical History:  Procedure Laterality Date  . ABDOMINAL HYSTERECTOMY  1972  . APPENDECTOMY  1973  . cholecystecomy  2016  . CHOLECYSTECTOMY    . CYST EXCISION     between 4th & 5th Lumbar  . FINGER AMPUTATION     Right 3rd & 4th  . FOOT SURGERY     Bilateral   . HEMORRHOID SURGERY  1971  . KNEE SURGERY Right 2005  . SHOULDER ARTHROSCOPY     Left  . TONSILLECTOMY  1973  . TOTAL HIP ARTHROPLASTY Left 05/21/2019  . TOTAL HIP ARTHROPLASTY Left 05/21/2019   Procedure: LEFT TOTAL HIP ARTHROPLASTY-DIRECT ANTERIOR APPROACH;  Surgeon: Marybelle Killings, MD;  Location: [facility];   Service: Orthopedics;  Laterality: Left;   Social History   Occupational History  . Not on file  Tobacco Use  . Smoking status: Never Smoker  . Smokeless tobacco: Never Used  Substance and Sexual Activity  . Alcohol use: Yes    Comment: occ  . Drug use: Never  . Sexual activity: Not on file

## 2019-08-05 DIAGNOSIS — M545 Low back pain: Secondary | ICD-10-CM | POA: Diagnosis not present

## 2019-08-05 DIAGNOSIS — E782 Mixed hyperlipidemia: Secondary | ICD-10-CM | POA: Diagnosis not present

## 2019-08-05 DIAGNOSIS — F411 Generalized anxiety disorder: Secondary | ICD-10-CM | POA: Diagnosis not present

## 2019-08-05 DIAGNOSIS — I10 Essential (primary) hypertension: Secondary | ICD-10-CM | POA: Diagnosis not present

## 2019-08-05 NOTE — Addendum Note (Signed)
Addended by: Meyer Cory on: 08/05/2019 03:48 PM   Modules accepted: Orders

## 2019-08-16 DIAGNOSIS — R2689 Other abnormalities of gait and mobility: Secondary | ICD-10-CM | POA: Diagnosis not present

## 2019-08-16 DIAGNOSIS — M5135 Other intervertebral disc degeneration, thoracolumbar region: Secondary | ICD-10-CM | POA: Diagnosis not present

## 2019-08-16 DIAGNOSIS — M1611 Unilateral primary osteoarthritis, right hip: Secondary | ICD-10-CM | POA: Diagnosis not present

## 2019-08-16 DIAGNOSIS — M25552 Pain in left hip: Secondary | ICD-10-CM | POA: Diagnosis not present

## 2019-08-16 DIAGNOSIS — M6281 Muscle weakness (generalized): Secondary | ICD-10-CM | POA: Diagnosis not present

## 2019-08-19 DIAGNOSIS — M6281 Muscle weakness (generalized): Secondary | ICD-10-CM | POA: Diagnosis not present

## 2019-08-19 DIAGNOSIS — M25552 Pain in left hip: Secondary | ICD-10-CM | POA: Diagnosis not present

## 2019-08-19 DIAGNOSIS — R2689 Other abnormalities of gait and mobility: Secondary | ICD-10-CM | POA: Diagnosis not present

## 2019-08-19 DIAGNOSIS — M5135 Other intervertebral disc degeneration, thoracolumbar region: Secondary | ICD-10-CM | POA: Diagnosis not present

## 2019-08-19 DIAGNOSIS — M1611 Unilateral primary osteoarthritis, right hip: Secondary | ICD-10-CM | POA: Diagnosis not present

## 2019-08-24 DIAGNOSIS — M25552 Pain in left hip: Secondary | ICD-10-CM | POA: Diagnosis not present

## 2019-08-24 DIAGNOSIS — M5135 Other intervertebral disc degeneration, thoracolumbar region: Secondary | ICD-10-CM | POA: Diagnosis not present

## 2019-08-24 DIAGNOSIS — R2689 Other abnormalities of gait and mobility: Secondary | ICD-10-CM | POA: Diagnosis not present

## 2019-08-24 DIAGNOSIS — M1611 Unilateral primary osteoarthritis, right hip: Secondary | ICD-10-CM | POA: Diagnosis not present

## 2019-08-24 DIAGNOSIS — M6281 Muscle weakness (generalized): Secondary | ICD-10-CM | POA: Diagnosis not present

## 2019-08-26 DIAGNOSIS — M5135 Other intervertebral disc degeneration, thoracolumbar region: Secondary | ICD-10-CM | POA: Diagnosis not present

## 2019-08-26 DIAGNOSIS — R2689 Other abnormalities of gait and mobility: Secondary | ICD-10-CM | POA: Diagnosis not present

## 2019-08-26 DIAGNOSIS — M1611 Unilateral primary osteoarthritis, right hip: Secondary | ICD-10-CM | POA: Diagnosis not present

## 2019-08-26 DIAGNOSIS — M6281 Muscle weakness (generalized): Secondary | ICD-10-CM | POA: Diagnosis not present

## 2019-08-26 DIAGNOSIS — M25552 Pain in left hip: Secondary | ICD-10-CM | POA: Diagnosis not present

## 2019-08-31 DIAGNOSIS — M1611 Unilateral primary osteoarthritis, right hip: Secondary | ICD-10-CM | POA: Diagnosis not present

## 2019-08-31 DIAGNOSIS — M25552 Pain in left hip: Secondary | ICD-10-CM | POA: Diagnosis not present

## 2019-08-31 DIAGNOSIS — M5135 Other intervertebral disc degeneration, thoracolumbar region: Secondary | ICD-10-CM | POA: Diagnosis not present

## 2019-08-31 DIAGNOSIS — R2689 Other abnormalities of gait and mobility: Secondary | ICD-10-CM | POA: Diagnosis not present

## 2019-08-31 DIAGNOSIS — M6281 Muscle weakness (generalized): Secondary | ICD-10-CM | POA: Diagnosis not present

## 2019-09-03 DIAGNOSIS — M5135 Other intervertebral disc degeneration, thoracolumbar region: Secondary | ICD-10-CM | POA: Diagnosis not present

## 2019-09-03 DIAGNOSIS — M25552 Pain in left hip: Secondary | ICD-10-CM | POA: Diagnosis not present

## 2019-09-03 DIAGNOSIS — M6281 Muscle weakness (generalized): Secondary | ICD-10-CM | POA: Diagnosis not present

## 2019-09-03 DIAGNOSIS — R2689 Other abnormalities of gait and mobility: Secondary | ICD-10-CM | POA: Diagnosis not present

## 2019-09-03 DIAGNOSIS — M1611 Unilateral primary osteoarthritis, right hip: Secondary | ICD-10-CM | POA: Diagnosis not present

## 2019-09-06 DIAGNOSIS — M25552 Pain in left hip: Secondary | ICD-10-CM | POA: Diagnosis not present

## 2019-09-06 DIAGNOSIS — M5135 Other intervertebral disc degeneration, thoracolumbar region: Secondary | ICD-10-CM | POA: Diagnosis not present

## 2019-09-06 DIAGNOSIS — R2689 Other abnormalities of gait and mobility: Secondary | ICD-10-CM | POA: Diagnosis not present

## 2019-09-06 DIAGNOSIS — M1611 Unilateral primary osteoarthritis, right hip: Secondary | ICD-10-CM | POA: Diagnosis not present

## 2019-09-06 DIAGNOSIS — M6281 Muscle weakness (generalized): Secondary | ICD-10-CM | POA: Diagnosis not present

## 2019-09-09 DIAGNOSIS — R2689 Other abnormalities of gait and mobility: Secondary | ICD-10-CM | POA: Diagnosis not present

## 2019-09-09 DIAGNOSIS — M1611 Unilateral primary osteoarthritis, right hip: Secondary | ICD-10-CM | POA: Diagnosis not present

## 2019-09-09 DIAGNOSIS — M25552 Pain in left hip: Secondary | ICD-10-CM | POA: Diagnosis not present

## 2019-09-09 DIAGNOSIS — M6281 Muscle weakness (generalized): Secondary | ICD-10-CM | POA: Diagnosis not present

## 2019-09-09 DIAGNOSIS — M5135 Other intervertebral disc degeneration, thoracolumbar region: Secondary | ICD-10-CM | POA: Diagnosis not present

## 2019-09-23 ENCOUNTER — Ambulatory Visit (INDEPENDENT_AMBULATORY_CARE_PROVIDER_SITE_OTHER): Payer: Medicare PPO | Admitting: Orthopaedic Surgery

## 2019-09-23 ENCOUNTER — Encounter: Payer: Self-pay | Admitting: Orthopaedic Surgery

## 2019-09-23 VITALS — BP 153/91 | HR 61 | Ht 64.0 in | Wt 170.0 lb

## 2019-09-23 DIAGNOSIS — Z96642 Presence of left artificial hip joint: Secondary | ICD-10-CM

## 2019-09-23 NOTE — Progress Notes (Signed)
Office Visit Note   Patient: Kimberly Wood           Date of Birth: 08-28-1936           MRN: [id number] Visit Date: 09/23/2019              Requested by: Toma Deiters, MD [address] [place],  [place] [address] PCP: Toma Deiters, MD   Assessment & Plan: Visit Diagnoses:  1. Hx of total hip arthroplasty, left   2.   Low back pain  Plan: We discussed hip abductor exercises which she can do in the right lateral decubitus position.  Will I demonstrated the exercises she understands what she supposed to do she will work on that and walking.  I will check her back again in 3 months and on return if she still having some back problems will obtain some AP and lateral lumbar images.  Follow-Up Instructions: Return in about 3 months (around 12/24/2019).   Orders:  No orders of the defined types were placed in this encounter.  No orders of the defined types were placed in this encounter.     Procedures: No procedures performed   Clinical Data: No additional findings.   Subjective: Chief Complaint  Patient presents with  . Left Hip - Follow-up    05/21/2019 Left THA  . Lower Back - Pain    HPI 83 year old female returns post left total hip arthroplasty 05/21/2019.  She went to therapy in [place] states her hip started hurting worse after that time.  She continues to have some back problems which we have not addressed at this point.  She still has mild Trendelenburg gait.  She states she is being able to do more things than she could before her surgery.  Review of Systems 14 point update unchanged from July 2020.   Objective: Vital Signs: BP (!) 153/91   Pulse 61   Ht 5\' 4"  (1.626 m)   Wt 170 lb (77.1 kg)   BMI 29.18 kg/m   Physical Exam Constitutional:      Appearance: She is well-developed.  HENT:     Head: Normocephalic.     Right Ear: External ear normal.     Left Ear: External ear normal.  Eyes:     Pupils: Pupils are equal, round, and reactive  to light.  Neck:     Thyroid: No thyromegaly.     Trachea: No tracheal deviation.  Cardiovascular:     Rate and Rhythm: Normal rate.  Pulmonary:     Effort: Pulmonary effort is normal.  Abdominal:     Palpations: Abdomen is soft.  Skin:    General: Skin is warm and dry.  Neurological:     Mental Status: She is alert and oriented to person, place, and time.  Psychiatric:        Behavior: Behavior normal.     Ortho Exam left hip incision well-healed.  Leg lengths are equal.  She is amatory with a slight Trendelenburg gait on the left. Specialty Comments:  No specialty comments available.  Imaging: No results found.   PMFS History: Patient Active Problem List   Diagnosis Date Noted  . Procedure and treatment not carried out due to patient leaving prior to being seen by health care provider 07/15/2019  . Hx of total hip arthroplasty, left 06/01/2019  . Rheumatoid arthritis involving left hip (HCC) 05/01/2019   Past Medical History:  Diagnosis Date  . Anxiety   .  Arthritis   . Complication of anesthesia    Hard to wake up and hallucinations  . Depression   . GERD (gastroesophageal reflux disease)   . Hypertension   . Migraines   . PONV (postoperative nausea and vomiting)   . Pulmonary embolism ([facility])     No family history on file.  Past Surgical History:  Procedure Laterality Date  . ABDOMINAL HYSTERECTOMY  1972  . APPENDECTOMY  1973  . cholecystecomy  2016  . CHOLECYSTECTOMY    . CYST EXCISION     between 4th & 5th Lumbar  . FINGER AMPUTATION     Right 3rd & 4th  . FOOT SURGERY     Bilateral   . HEMORRHOID SURGERY  1971  . KNEE SURGERY Right 2005  . SHOULDER ARTHROSCOPY     Left  . TONSILLECTOMY  1973  . TOTAL HIP ARTHROPLASTY Left 05/21/2019  . TOTAL HIP ARTHROPLASTY Left 05/21/2019   Procedure: LEFT TOTAL HIP ARTHROPLASTY-DIRECT ANTERIOR APPROACH;  Surgeon: Marybelle Killings, MD;  Location: [facility];  Service: Orthopedics;  Laterality: Left;   Social History    Occupational History  . Not on file  Tobacco Use  . Smoking status: Never Smoker  . Smokeless tobacco: Never Used  Substance and Sexual Activity  . Alcohol use: Yes    Comment: occ  . Drug use: Never  . Sexual activity: Not on file

## 2019-10-04 DIAGNOSIS — Z6829 Body mass index (BMI) 29.0-29.9, adult: Secondary | ICD-10-CM | POA: Diagnosis not present

## 2019-10-04 DIAGNOSIS — I10 Essential (primary) hypertension: Secondary | ICD-10-CM | POA: Diagnosis not present

## 2019-10-04 DIAGNOSIS — F332 Major depressive disorder, recurrent severe without psychotic features: Secondary | ICD-10-CM | POA: Diagnosis not present

## 2019-10-04 DIAGNOSIS — F411 Generalized anxiety disorder: Secondary | ICD-10-CM | POA: Diagnosis not present

## 2019-10-04 DIAGNOSIS — N182 Chronic kidney disease, stage 2 (mild): Secondary | ICD-10-CM | POA: Diagnosis not present

## 2019-10-04 DIAGNOSIS — K219 Gastro-esophageal reflux disease without esophagitis: Secondary | ICD-10-CM | POA: Diagnosis not present

## 2019-10-04 DIAGNOSIS — Z0000 Encounter for general adult medical examination without abnormal findings: Secondary | ICD-10-CM | POA: Diagnosis not present

## 2019-10-04 DIAGNOSIS — M545 Low back pain: Secondary | ICD-10-CM | POA: Diagnosis not present

## 2019-10-04 DIAGNOSIS — E669 Obesity, unspecified: Secondary | ICD-10-CM | POA: Diagnosis not present

## 2019-10-13 DIAGNOSIS — F332 Major depressive disorder, recurrent severe without psychotic features: Secondary | ICD-10-CM | POA: Diagnosis not present

## 2019-10-13 DIAGNOSIS — N182 Chronic kidney disease, stage 2 (mild): Secondary | ICD-10-CM | POA: Diagnosis not present

## 2019-10-13 DIAGNOSIS — K219 Gastro-esophageal reflux disease without esophagitis: Secondary | ICD-10-CM | POA: Diagnosis not present

## 2019-10-13 DIAGNOSIS — F411 Generalized anxiety disorder: Secondary | ICD-10-CM | POA: Diagnosis not present

## 2019-10-13 DIAGNOSIS — I10 Essential (primary) hypertension: Secondary | ICD-10-CM | POA: Diagnosis not present

## 2019-10-13 DIAGNOSIS — M545 Low back pain: Secondary | ICD-10-CM | POA: Diagnosis not present

## 2019-11-09 DIAGNOSIS — F332 Major depressive disorder, recurrent severe without psychotic features: Secondary | ICD-10-CM | POA: Diagnosis not present

## 2019-11-09 DIAGNOSIS — F411 Generalized anxiety disorder: Secondary | ICD-10-CM | POA: Diagnosis not present

## 2019-11-09 DIAGNOSIS — N182 Chronic kidney disease, stage 2 (mild): Secondary | ICD-10-CM | POA: Diagnosis not present

## 2019-11-09 DIAGNOSIS — M545 Low back pain: Secondary | ICD-10-CM | POA: Diagnosis not present

## 2019-11-09 DIAGNOSIS — K219 Gastro-esophageal reflux disease without esophagitis: Secondary | ICD-10-CM | POA: Diagnosis not present

## 2019-11-09 DIAGNOSIS — I10 Essential (primary) hypertension: Secondary | ICD-10-CM | POA: Diagnosis not present

## 2019-12-17 DIAGNOSIS — F411 Generalized anxiety disorder: Secondary | ICD-10-CM | POA: Diagnosis not present

## 2019-12-17 DIAGNOSIS — K219 Gastro-esophageal reflux disease without esophagitis: Secondary | ICD-10-CM | POA: Diagnosis not present

## 2019-12-17 DIAGNOSIS — F332 Major depressive disorder, recurrent severe without psychotic features: Secondary | ICD-10-CM | POA: Diagnosis not present

## 2019-12-17 DIAGNOSIS — M545 Low back pain: Secondary | ICD-10-CM | POA: Diagnosis not present

## 2019-12-17 DIAGNOSIS — N182 Chronic kidney disease, stage 2 (mild): Secondary | ICD-10-CM | POA: Diagnosis not present

## 2019-12-17 DIAGNOSIS — I10 Essential (primary) hypertension: Secondary | ICD-10-CM | POA: Diagnosis not present

## 2019-12-23 ENCOUNTER — Ambulatory Visit: Payer: Medicare PPO | Admitting: Orthopaedic Surgery

## 2019-12-30 ENCOUNTER — Ambulatory Visit (INDEPENDENT_AMBULATORY_CARE_PROVIDER_SITE_OTHER): Payer: Medicare PPO | Admitting: Orthopaedic Surgery

## 2019-12-30 ENCOUNTER — Ambulatory Visit (INDEPENDENT_AMBULATORY_CARE_PROVIDER_SITE_OTHER): Payer: Medicare PPO

## 2019-12-30 ENCOUNTER — Encounter: Payer: Self-pay | Admitting: Orthopaedic Surgery

## 2019-12-30 VITALS — BP 154/78 | HR 63 | Ht 64.0 in | Wt 165.0 lb

## 2019-12-30 DIAGNOSIS — M5136 Other intervertebral disc degeneration, lumbar region: Secondary | ICD-10-CM | POA: Diagnosis not present

## 2019-12-30 DIAGNOSIS — G8929 Other chronic pain: Secondary | ICD-10-CM

## 2019-12-30 DIAGNOSIS — M545 Low back pain: Secondary | ICD-10-CM

## 2019-12-30 DIAGNOSIS — Z96642 Presence of left artificial hip joint: Secondary | ICD-10-CM | POA: Diagnosis not present

## 2019-12-30 DIAGNOSIS — M4316 Spondylolisthesis, lumbar region: Secondary | ICD-10-CM

## 2019-12-30 NOTE — Progress Notes (Signed)
Office Visit Note   Patient: Kimberly Wood           Date of Birth: September 25, 1936           MRN: [id number] Visit Date: 12/30/2019              Requested by: Neale Burly, MD [place],  [place] [address] PCP: Neale Burly, MD   Assessment & Plan: Visit Diagnoses:  1. Chronic bilateral low back pain, unspecified whether sciatica present   2. Spondylolisthesis of lumbar region   3. Hx of total hip arthroplasty, left   4. Other intervertebral disc degeneration, lumbar region     Plan: We will proceed with MRI scan with and without contrast for evaluation of her spinal stenosis symptoms.  Office follow-up after scan for review.  Follow-Up Instructions: No follow-ups on file.   Orders:  Orders Placed This Encounter  Procedures  . XR Lumbar Spine 2-3 Views  . MR LUMBAR SPINE W WO CONTRAST   No orders of the defined types were placed in this encounter.     Procedures: No procedures performed   Clinical Data: No additional findings.   Subjective: Chief Complaint  Patient presents with  . Lower Back - Follow-up, Pain  . Left Hip - Follow-up    05/21/2019 Left THA    HPI 84 year old female returns post total hip arthroplasty on the left doing well.  She states her back is hurting her and she is only able to walk about 57feet.  She is used some tramadol but does not help with her back pain.  Past history of years ago a tubular microdiscectomy at the L5-S1 level.  Patient states she has to lean on a grocery cart to get some relief cannot stand long more than 5 to 10 minutes and then has to sit down.  If she stops and leans her chest or elbows onto the counter she gets some improvement.  No bowel or bladder associated symptoms.  Review of Systems previous lumbar microdiscectomy L5-S1 2017 done in [place].  Left total hip arthroplasty July 2020 doing well.  Rheumatoid arthritis history.  Otherwise negative is obtains HPI.   Objective: Vital Signs: BP (!)  154/78   Pulse 63   Ht 5\' 4"  (1.626 m)   Wt 165 lb (74.8 kg)   BMI 28.32 kg/m   Physical Exam Constitutional:      Appearance: She is well-developed.  HENT:     Head: Normocephalic.     Right Ear: External ear normal.     Left Ear: External ear normal.  Eyes:     Pupils: Pupils are equal, round, and reactive to light.  Neck:     Thyroid: No thyromegaly.     Trachea: No tracheal deviation.  Cardiovascular:     Rate and Rhythm: Normal rate.  Pulmonary:     Effort: Pulmonary effort is normal.  Abdominal:     Palpations: Abdomen is soft.  Skin:    General: Skin is warm and dry.  Neurological:     Mental Status: She is alert and oriented to person, place, and time.  Psychiatric:        Behavior: Behavior normal.     Ortho Exam patient has well-healed hip incision on the left.  Negative logroll right and left knees reach full extension reflexes are 2+ lumbar incisions well-healed.  She has pain with straight leg raising on the left at 80 degrees positive popliteal compression  test.  She is able to heel and toe walk.  Distal pulses palpable.  Specialty Comments:  No specialty comments available.  Imaging: No results found.   PMFS History: Patient Active Problem List   Diagnosis Date Noted  . Other intervertebral disc degeneration, lumbar region 01/03/2020  . Procedure and treatment not carried out due to patient leaving prior to being seen by health care provider 07/15/2019  . Hx of total hip arthroplasty, left 06/01/2019  . Rheumatoid arthritis involving left hip (HCC) 05/01/2019   Past Medical History:  Diagnosis Date  . Anxiety   . Arthritis   . Complication of anesthesia    Hard to wake up and hallucinations  . Depression   . GERD (gastroesophageal reflux disease)   . Hypertension   . Migraines   . PONV (postoperative nausea and vomiting)   . Pulmonary embolism (HCC)     No family history on file.  Past Surgical History:  Procedure Laterality Date  .  ABDOMINAL HYSTERECTOMY  1972  . APPENDECTOMY  1973  . cholecystecomy  2016  . CHOLECYSTECTOMY    . CYST EXCISION     between 4th & 5th Lumbar  . FINGER AMPUTATION     Right 3rd & 4th  . FOOT SURGERY     Bilateral   . HEMORRHOID SURGERY  1971  . KNEE SURGERY Right 2005  . SHOULDER ARTHROSCOPY     Left  . TONSILLECTOMY  1973  . TOTAL HIP ARTHROPLASTY Left 05/21/2019  . TOTAL HIP ARTHROPLASTY Left 05/21/2019   Procedure: LEFT TOTAL HIP ARTHROPLASTY-DIRECT ANTERIOR APPROACH;  Surgeon: Eldred Manges, MD;  Location: [facility];  Service: Orthopedics;  Laterality: Left;   Social History   Occupational History  . Not on file  Tobacco Use  . Smoking status: Never Smoker  . Smokeless tobacco: Never Used  Substance and Sexual Activity  . Alcohol use: Yes    Comment: occ  . Drug use: Never  . Sexual activity: Not on file

## 2020-01-03 ENCOUNTER — Encounter: Payer: Self-pay | Admitting: Orthopaedic Surgery

## 2020-01-03 DIAGNOSIS — M5136 Other intervertebral disc degeneration, lumbar region: Secondary | ICD-10-CM | POA: Insufficient documentation

## 2020-01-10 ENCOUNTER — Encounter: Payer: Self-pay | Admitting: Orthopaedic Surgery

## 2020-01-10 DIAGNOSIS — K219 Gastro-esophageal reflux disease without esophagitis: Secondary | ICD-10-CM | POA: Diagnosis not present

## 2020-01-10 DIAGNOSIS — F411 Generalized anxiety disorder: Secondary | ICD-10-CM | POA: Diagnosis not present

## 2020-01-10 DIAGNOSIS — M47816 Spondylosis without myelopathy or radiculopathy, lumbar region: Secondary | ICD-10-CM | POA: Diagnosis not present

## 2020-01-10 DIAGNOSIS — M48061 Spinal stenosis, lumbar region without neurogenic claudication: Secondary | ICD-10-CM | POA: Diagnosis not present

## 2020-01-10 DIAGNOSIS — I10 Essential (primary) hypertension: Secondary | ICD-10-CM | POA: Diagnosis not present

## 2020-01-10 DIAGNOSIS — M545 Low back pain: Secondary | ICD-10-CM | POA: Diagnosis not present

## 2020-01-10 DIAGNOSIS — M5126 Other intervertebral disc displacement, lumbar region: Secondary | ICD-10-CM | POA: Diagnosis not present

## 2020-01-10 DIAGNOSIS — F332 Major depressive disorder, recurrent severe without psychotic features: Secondary | ICD-10-CM | POA: Diagnosis not present

## 2020-01-10 DIAGNOSIS — M4316 Spondylolisthesis, lumbar region: Secondary | ICD-10-CM | POA: Diagnosis not present

## 2020-01-10 DIAGNOSIS — N182 Chronic kidney disease, stage 2 (mild): Secondary | ICD-10-CM | POA: Diagnosis not present

## 2020-01-13 ENCOUNTER — Encounter: Payer: Self-pay | Admitting: Orthopaedic Surgery

## 2020-01-13 ENCOUNTER — Ambulatory Visit (INDEPENDENT_AMBULATORY_CARE_PROVIDER_SITE_OTHER): Payer: Medicare PPO | Admitting: Orthopaedic Surgery

## 2020-01-13 VITALS — BP 139/80 | HR 63 | Ht 64.0 in | Wt 165.0 lb

## 2020-01-13 DIAGNOSIS — G8929 Other chronic pain: Secondary | ICD-10-CM | POA: Diagnosis not present

## 2020-01-13 DIAGNOSIS — M545 Low back pain: Secondary | ICD-10-CM

## 2020-01-13 NOTE — Progress Notes (Signed)
Office Visit Note   Patient: Kimberly Wood           Date of Birth: [birth year]           MRN: [id number] Visit Date: 01/13/2020              Requested by: Neale Burly, MD [place],  [place] [address] PCP: Neale Burly, MD   Assessment & Plan: Visit Diagnoses:  1. Chronic bilateral low back pain, unspecified whether sciatica present     Plan: MRI scan images were reviewed I gave her a copy of the report.  She has not demonstrated progression from previous scan 9 months ago.  We will set her up for physical therapy in [place].  I plan to recheck her in 8 weeks.  She has multilevel involvement but does not have any levels with severe compression.  Follow-Up Instructions: Return in about 8 weeks (around 03/09/2020).   Orders:  Orders Placed This Encounter  Procedures  . Ambulatory referral to Physical Therapy   No orders of the defined types were placed in this encounter.     Procedures: No procedures performed   Clinical Data: No additional findings.   Subjective: Chief Complaint  Patient presents with  . Lower Back - Follow-up, Pain    MRI Review    HPI 84 year old female returns with ongoing back pain she states at times it is terrible.  She has trouble walking extended length of time.  When she goes the store she does not have to lean on the grocery cart but grabs the handle tightly.  Tramadol eases the pain somewhat.  Previous epidural injections in the past.  Left L5-S1 microdiscectomy cyst removal.  MRI scan has been obtained and is reviewed.  This shows been no progression in the last 9 months but multilevel involvement of disc bulging as described below an MRI report.  Today reviewed the MRI scan and discussed recommendations for a course of therapy.  Review of Systems 14 point update unchanged from 12/30/2019 office visit other than as mentioned HPI.  Objective: Vital Signs: BP 139/80   Pulse 63   Ht 5\' 4"  (1.626 m)   Wt 165 lb  (74.8 kg)   BMI 28.32 kg/m   Physical Exam Constitutional:      Appearance: She is well-developed.  HENT:     Head: Normocephalic.     Right Ear: External ear normal.     Left Ear: External ear normal.  Eyes:     Pupils: Pupils are equal, round, and reactive to light.  Neck:     Thyroid: No thyromegaly.     Trachea: No tracheal deviation.  Cardiovascular:     Rate and Rhythm: Normal rate.  Pulmonary:     Effort: Pulmonary effort is normal.  Abdominal:     Palpations: Abdomen is soft.  Skin:    General: Skin is warm and dry.  Neurological:     Mental Status: She is alert and oriented to person, place, and time.  Psychiatric:        Behavior: Behavior normal.     Ortho Exam left L5-S1 incisions well-healed no sciatic notch tenderness.  Negative logroll right and left hips knees reach full extension reflexes are 2+ and symmetrical.  Some discomfort straight leg raising 80 degrees on the left.  She can heel and toe walk distal pulses are palpable.  Specialty Comments:  No specialty comments available.  Imaging:  T12-L1: Advanced disc  and endplate degeneration with annular disc bulging and endplate osteophytes asymmetric to the left. Stable moderate chronic left foraminal narrowing.  L1-2: Stable mild disc bulging and bilateral facet hypertrophy. Stable mild foraminal narrowing bilaterally.  L2-3: Stable loss of disc height with annular disc bulging and endplate osteophytes asymmetric to the right. Mild facet and ligamentous hypertrophy. Stable right-greater-than-left foraminal narrowing and mild narrowing of both lateral recesses.  L3-4: Stable disc bulging, facet and ligamentous hypertrophy. Stable mild spinal stenosis and mild narrowing of both foramina.  L4-5: Advanced facet arthropathy accounts for the grade 1 anterolisthesis. There is stable annular disc bulging contributing to mild chronic narrowing of the right lateral recess and both foramina.  L5-S1: Remote postsurgical  changes on the left. Chronic loss of disc height with annular disc bulging and endplate osteophytes asymmetric to the left. Moderate bilateral facet hypertrophy. Stable chronic left foraminal narrowing.  IMPRESSION: 1. No acute findings or significant changes are seen compared with previous MRI from 9 months ago. 2. Multilevel spondylosis with disc bulging, endplate osteophytes and facet hypertrophy as described. 3. No high-grade spinal stenosis. 4. Multilevel foraminal narrowing as described, similar to prior study.   Electronically Signed By: Carey Bullocks M.D. On: 01/10/2020 14:15    PMFS History: Patient Active Problem List   Diagnosis Date Noted  . Other intervertebral disc degeneration, lumbar region 01/03/2020  . Procedure and treatment not carried out due to patient leaving prior to being seen by health care provider 07/15/2019  . Hx of total hip arthroplasty, left 06/01/2019  . Rheumatoid arthritis involving left hip (HCC) 05/01/2019   Past Medical History:  Diagnosis Date  . Anxiety   . Arthritis   . Complication of anesthesia    Hard to wake up and hallucinations  . Depression   . GERD (gastroesophageal reflux disease)   . Hypertension   . Migraines   . PONV (postoperative nausea and vomiting)   . Pulmonary embolism (HCC)     No family history on file.  Past Surgical History:  Procedure Laterality Date  . ABDOMINAL HYSTERECTOMY  1972  . APPENDECTOMY  1973  . cholecystecomy  2016  . CHOLECYSTECTOMY    . CYST EXCISION     between 4th & 5th Lumbar  . FINGER AMPUTATION     Right 3rd & 4th  . FOOT SURGERY     Bilateral   . HEMORRHOID SURGERY  1971  . KNEE SURGERY Right 2005  . SHOULDER ARTHROSCOPY     Left  . TONSILLECTOMY  1973  . TOTAL HIP ARTHROPLASTY Left 05/21/2019  . TOTAL HIP ARTHROPLASTY Left 05/21/2019   Procedure: LEFT TOTAL HIP ARTHROPLASTY-DIRECT ANTERIOR APPROACH;  Surgeon: Eldred Manges, MD;  Location: [facility];  Service: Orthopedics;  Laterality: Left;    Social History   Occupational History  . Not on file  Tobacco Use  . Smoking status: Never Smoker  . Smokeless tobacco: Never Used  Substance and Sexual Activity  . Alcohol use: Yes    Comment: occ  . Drug use: Never  . Sexual activity: Not on file

## 2020-01-17 DIAGNOSIS — M545 Low back pain: Secondary | ICD-10-CM | POA: Diagnosis not present

## 2020-01-17 DIAGNOSIS — R262 Difficulty in walking, not elsewhere classified: Secondary | ICD-10-CM | POA: Diagnosis not present

## 2020-01-17 DIAGNOSIS — M6281 Muscle weakness (generalized): Secondary | ICD-10-CM | POA: Diagnosis not present

## 2020-01-21 DIAGNOSIS — M6281 Muscle weakness (generalized): Secondary | ICD-10-CM | POA: Diagnosis not present

## 2020-01-21 DIAGNOSIS — M545 Low back pain: Secondary | ICD-10-CM | POA: Diagnosis not present

## 2020-01-21 DIAGNOSIS — R262 Difficulty in walking, not elsewhere classified: Secondary | ICD-10-CM | POA: Diagnosis not present

## 2020-01-24 DIAGNOSIS — Z6829 Body mass index (BMI) 29.0-29.9, adult: Secondary | ICD-10-CM | POA: Diagnosis not present

## 2020-01-24 DIAGNOSIS — K219 Gastro-esophageal reflux disease without esophagitis: Secondary | ICD-10-CM | POA: Diagnosis not present

## 2020-01-24 DIAGNOSIS — M6281 Muscle weakness (generalized): Secondary | ICD-10-CM | POA: Diagnosis not present

## 2020-01-24 DIAGNOSIS — F332 Major depressive disorder, recurrent severe without psychotic features: Secondary | ICD-10-CM | POA: Diagnosis not present

## 2020-01-24 DIAGNOSIS — I10 Essential (primary) hypertension: Secondary | ICD-10-CM | POA: Diagnosis not present

## 2020-01-24 DIAGNOSIS — E669 Obesity, unspecified: Secondary | ICD-10-CM | POA: Diagnosis not present

## 2020-01-24 DIAGNOSIS — F411 Generalized anxiety disorder: Secondary | ICD-10-CM | POA: Diagnosis not present

## 2020-01-24 DIAGNOSIS — R262 Difficulty in walking, not elsewhere classified: Secondary | ICD-10-CM | POA: Diagnosis not present

## 2020-01-24 DIAGNOSIS — Z0000 Encounter for general adult medical examination without abnormal findings: Secondary | ICD-10-CM | POA: Diagnosis not present

## 2020-01-24 DIAGNOSIS — N182 Chronic kidney disease, stage 2 (mild): Secondary | ICD-10-CM | POA: Diagnosis not present

## 2020-01-24 DIAGNOSIS — M545 Low back pain: Secondary | ICD-10-CM | POA: Diagnosis not present

## 2020-01-28 DIAGNOSIS — M6281 Muscle weakness (generalized): Secondary | ICD-10-CM | POA: Diagnosis not present

## 2020-01-28 DIAGNOSIS — M545 Low back pain: Secondary | ICD-10-CM | POA: Diagnosis not present

## 2020-01-28 DIAGNOSIS — R262 Difficulty in walking, not elsewhere classified: Secondary | ICD-10-CM | POA: Diagnosis not present

## 2020-01-31 DIAGNOSIS — M6281 Muscle weakness (generalized): Secondary | ICD-10-CM | POA: Diagnosis not present

## 2020-01-31 DIAGNOSIS — R262 Difficulty in walking, not elsewhere classified: Secondary | ICD-10-CM | POA: Diagnosis not present

## 2020-01-31 DIAGNOSIS — M545 Low back pain: Secondary | ICD-10-CM | POA: Diagnosis not present

## 2020-02-03 DIAGNOSIS — R262 Difficulty in walking, not elsewhere classified: Secondary | ICD-10-CM | POA: Diagnosis not present

## 2020-02-03 DIAGNOSIS — M545 Low back pain: Secondary | ICD-10-CM | POA: Diagnosis not present

## 2020-02-03 DIAGNOSIS — M6281 Muscle weakness (generalized): Secondary | ICD-10-CM | POA: Diagnosis not present

## 2020-02-08 DIAGNOSIS — F411 Generalized anxiety disorder: Secondary | ICD-10-CM | POA: Diagnosis not present

## 2020-02-08 DIAGNOSIS — I10 Essential (primary) hypertension: Secondary | ICD-10-CM | POA: Diagnosis not present

## 2020-02-08 DIAGNOSIS — N182 Chronic kidney disease, stage 2 (mild): Secondary | ICD-10-CM | POA: Diagnosis not present

## 2020-02-08 DIAGNOSIS — F332 Major depressive disorder, recurrent severe without psychotic features: Secondary | ICD-10-CM | POA: Diagnosis not present

## 2020-02-08 DIAGNOSIS — M545 Low back pain: Secondary | ICD-10-CM | POA: Diagnosis not present

## 2020-02-08 DIAGNOSIS — K219 Gastro-esophageal reflux disease without esophagitis: Secondary | ICD-10-CM | POA: Diagnosis not present

## 2020-02-09 DIAGNOSIS — R262 Difficulty in walking, not elsewhere classified: Secondary | ICD-10-CM | POA: Diagnosis not present

## 2020-02-09 DIAGNOSIS — M545 Low back pain: Secondary | ICD-10-CM | POA: Diagnosis not present

## 2020-02-09 DIAGNOSIS — M6281 Muscle weakness (generalized): Secondary | ICD-10-CM | POA: Diagnosis not present

## 2020-02-11 DIAGNOSIS — M6281 Muscle weakness (generalized): Secondary | ICD-10-CM | POA: Diagnosis not present

## 2020-02-11 DIAGNOSIS — R262 Difficulty in walking, not elsewhere classified: Secondary | ICD-10-CM | POA: Diagnosis not present

## 2020-02-11 DIAGNOSIS — M545 Low back pain: Secondary | ICD-10-CM | POA: Diagnosis not present

## 2020-03-08 DIAGNOSIS — Z6828 Body mass index (BMI) 28.0-28.9, adult: Secondary | ICD-10-CM | POA: Diagnosis not present

## 2020-03-08 DIAGNOSIS — L309 Dermatitis, unspecified: Secondary | ICD-10-CM | POA: Diagnosis not present

## 2020-03-16 ENCOUNTER — Other Ambulatory Visit: Payer: Self-pay

## 2020-03-16 ENCOUNTER — Ambulatory Visit (INDEPENDENT_AMBULATORY_CARE_PROVIDER_SITE_OTHER): Payer: Medicare PPO | Admitting: Orthopaedic Surgery

## 2020-03-16 VITALS — BP 133/75 | HR 79

## 2020-03-16 DIAGNOSIS — M069 Rheumatoid arthritis, unspecified: Secondary | ICD-10-CM

## 2020-03-17 DIAGNOSIS — I10 Essential (primary) hypertension: Secondary | ICD-10-CM | POA: Diagnosis not present

## 2020-03-17 DIAGNOSIS — M545 Low back pain: Secondary | ICD-10-CM | POA: Diagnosis not present

## 2020-03-18 NOTE — Progress Notes (Signed)
Office Visit Note   Patient: Kimberly Wood           Date of Birth: 02/08/1936           MRN: [id number] Visit Date: 03/16/2020              Requested by: Toma Deiters, MD [address] [place],  [place] [address] PCP: Toma Deiters, MD   Assessment & Plan: Visit Diagnoses:  1. Rheumatoid arthritis involving left hip, unspecified whether rheumatoid factor present [facility])     Plan: Patient can talk with Dr.Hasanaj about possible rheumatology referral.  Patient thinks there is a rheumatoligist  in [place].  If not she could be referred to [facility] to rheumatology.  Likely should respond well the newer [employer] it appears that the facet arthropathy and in the lumbar spine is giving her considerable problems and of course she has joint pain from other areas related to rheumatoid arthritis that would likely also respond.  Follow-Up Instructions: No follow-ups on file.   Orders:  No orders of the defined types were placed in this encounter.  No orders of the defined types were placed in this encounter.     Procedures: No procedures performed   Clinical Data: No additional findings.   Subjective: Chief Complaint  Patient presents with  . Lower Back - Pain    HPI 84 year old female with chronic rheumatoid arthritis with significant deformity in her hands is seen for follow-up of low back pain.  Lumbar MRI scan 01/10/2020 showed no severe central stenosis.  She did have considerable facet arthropathy likely related a portion to her rheumatoid arthritis with some foraminal narrowing not significantly changed from previous MRI a few years ago.  Review of Systems patient's been on Fosamax also Celexa Voltaren Neurontin.  Previous ~up arthroplasty on the left.  Lumbar surgery 2017 done in [place].  Shoulder arthroscopy and debridement 2015.  I did her left total hip arthroplasty anterior approach 05/21/2019 which is doing well.   Objective: Vital Signs: BP 133/75    Pulse 79   Physical Exam Constitutional:      Appearance: She is well-developed.  HENT:     Head: Normocephalic.     Right Ear: External ear normal.     Left Ear: External ear normal.  Eyes:     Pupils: Pupils are equal, round, and reactive to light.  Neck:     Thyroid: No thyromegaly.     Trachea: No tracheal deviation.  Cardiovascular:     Rate and Rhythm: Normal rate.  Pulmonary:     Effort: Pulmonary effort is normal.  Abdominal:     Palpations: Abdomen is soft.  Skin:    General: Skin is warm and dry.  Neurological:     Mental Status: She is alert and oriented to person, place, and time.  Psychiatric:        Behavior: Behavior normal.     Ortho Exam patient has ulnar drift MCP joints and rheumatoid arthritic involvement in her fingers fairly severe.  She has pain with static palpation.  Negative logroll to the hips.  Anterior tib EHL is intact.  Specialty Comments:  No specialty comments available.  Imaging: No results found.   PMFS History: Patient Active Problem List   Diagnosis Date Noted  . Other intervertebral disc degeneration, lumbar region 01/03/2020  . Procedure and treatment not carried out due to patient leaving prior to being seen by health care provider 07/15/2019  . Hx of  total hip arthroplasty, left 06/01/2019  . Rheumatoid arthritis involving left hip ([place]) 05/01/2019   Past Medical History:  Diagnosis Date  . Anxiety   . Arthritis   . Complication of anesthesia    Hard to wake up and hallucinations  . Depression   . GERD (gastroesophageal reflux disease)   . Hypertension   . Migraines   . PONV (postoperative nausea and vomiting)   . Pulmonary embolism ([place])     No family history on file.  Past Surgical History:  Procedure Laterality Date  . ABDOMINAL HYSTERECTOMY  1972  . APPENDECTOMY  1973  . cholecystecomy  2016  . CHOLECYSTECTOMY    . CYST EXCISION     between 4th & 5th Lumbar  . FINGER AMPUTATION     Right 3rd & 4th  .  FOOT SURGERY     Bilateral   . HEMORRHOID SURGERY  1971  . KNEE SURGERY Right 2005  . SHOULDER ARTHROSCOPY     Left  . TONSILLECTOMY  1973  . TOTAL HIP ARTHROPLASTY Left 05/21/2019  . TOTAL HIP ARTHROPLASTY Left 05/21/2019   Procedure: LEFT TOTAL HIP ARTHROPLASTY-DIRECT ANTERIOR APPROACH;  Surgeon: Marybelle Killings, MD;  Location: [facility];  Service: Orthopedics;  Laterality: Left;   Social History   Occupational History  . Not on file  Tobacco Use  . Smoking status: Never Smoker  . Smokeless tobacco: Never Used  Substance and Sexual Activity  . Alcohol use: Yes    Comment: occ  . Drug use: Never  . Sexual activity: Not on file

## 2020-04-14 DIAGNOSIS — I10 Essential (primary) hypertension: Secondary | ICD-10-CM | POA: Diagnosis not present

## 2020-04-14 DIAGNOSIS — M545 Low back pain: Secondary | ICD-10-CM | POA: Diagnosis not present

## 2020-04-25 DIAGNOSIS — F332 Major depressive disorder, recurrent severe without psychotic features: Secondary | ICD-10-CM | POA: Diagnosis not present

## 2020-04-25 DIAGNOSIS — I10 Essential (primary) hypertension: Secondary | ICD-10-CM | POA: Diagnosis not present

## 2020-04-25 DIAGNOSIS — L309 Dermatitis, unspecified: Secondary | ICD-10-CM | POA: Diagnosis not present

## 2020-04-25 DIAGNOSIS — Z6829 Body mass index (BMI) 29.0-29.9, adult: Secondary | ICD-10-CM | POA: Diagnosis not present

## 2020-04-25 DIAGNOSIS — N182 Chronic kidney disease, stage 2 (mild): Secondary | ICD-10-CM | POA: Diagnosis not present

## 2020-04-25 DIAGNOSIS — J441 Chronic obstructive pulmonary disease with (acute) exacerbation: Secondary | ICD-10-CM | POA: Diagnosis not present

## 2020-04-25 DIAGNOSIS — K219 Gastro-esophageal reflux disease without esophagitis: Secondary | ICD-10-CM | POA: Diagnosis not present

## 2020-05-16 DIAGNOSIS — F332 Major depressive disorder, recurrent severe without psychotic features: Secondary | ICD-10-CM | POA: Diagnosis not present

## 2020-05-16 DIAGNOSIS — L309 Dermatitis, unspecified: Secondary | ICD-10-CM | POA: Diagnosis not present

## 2020-05-16 DIAGNOSIS — N182 Chronic kidney disease, stage 2 (mild): Secondary | ICD-10-CM | POA: Diagnosis not present

## 2020-05-16 DIAGNOSIS — K219 Gastro-esophageal reflux disease without esophagitis: Secondary | ICD-10-CM | POA: Diagnosis not present

## 2020-05-16 DIAGNOSIS — I10 Essential (primary) hypertension: Secondary | ICD-10-CM | POA: Diagnosis not present

## 2020-05-18 DIAGNOSIS — H40033 Anatomical narrow angle, bilateral: Secondary | ICD-10-CM | POA: Diagnosis not present

## 2020-05-18 DIAGNOSIS — Z6829 Body mass index (BMI) 29.0-29.9, adult: Secondary | ICD-10-CM | POA: Diagnosis not present

## 2020-05-18 DIAGNOSIS — I10 Essential (primary) hypertension: Secondary | ICD-10-CM | POA: Diagnosis not present

## 2020-05-18 DIAGNOSIS — H04123 Dry eye syndrome of bilateral lacrimal glands: Secondary | ICD-10-CM | POA: Diagnosis not present

## 2020-07-03 DIAGNOSIS — M545 Low back pain: Secondary | ICD-10-CM | POA: Diagnosis not present

## 2020-07-03 DIAGNOSIS — I10 Essential (primary) hypertension: Secondary | ICD-10-CM | POA: Diagnosis not present

## 2020-07-25 DIAGNOSIS — F3341 Major depressive disorder, recurrent, in partial remission: Secondary | ICD-10-CM | POA: Diagnosis not present

## 2020-07-25 DIAGNOSIS — M545 Low back pain: Secondary | ICD-10-CM | POA: Diagnosis not present

## 2020-07-25 DIAGNOSIS — M818 Other osteoporosis without current pathological fracture: Secondary | ICD-10-CM | POA: Diagnosis not present

## 2020-07-25 DIAGNOSIS — Z0000 Encounter for general adult medical examination without abnormal findings: Secondary | ICD-10-CM | POA: Diagnosis not present

## 2020-07-25 DIAGNOSIS — Z6829 Body mass index (BMI) 29.0-29.9, adult: Secondary | ICD-10-CM | POA: Diagnosis not present

## 2020-07-25 DIAGNOSIS — I10 Essential (primary) hypertension: Secondary | ICD-10-CM | POA: Diagnosis not present

## 2020-07-31 DIAGNOSIS — F3341 Major depressive disorder, recurrent, in partial remission: Secondary | ICD-10-CM | POA: Diagnosis not present

## 2020-07-31 DIAGNOSIS — M818 Other osteoporosis without current pathological fracture: Secondary | ICD-10-CM | POA: Diagnosis not present

## 2020-07-31 DIAGNOSIS — M545 Low back pain: Secondary | ICD-10-CM | POA: Diagnosis not present

## 2020-07-31 DIAGNOSIS — I10 Essential (primary) hypertension: Secondary | ICD-10-CM | POA: Diagnosis not present

## 2020-08-18 DIAGNOSIS — M545 Low back pain, unspecified: Secondary | ICD-10-CM | POA: Diagnosis not present

## 2020-08-18 DIAGNOSIS — I10 Essential (primary) hypertension: Secondary | ICD-10-CM | POA: Diagnosis not present

## 2020-08-18 DIAGNOSIS — F3341 Major depressive disorder, recurrent, in partial remission: Secondary | ICD-10-CM | POA: Diagnosis not present

## 2020-08-18 DIAGNOSIS — M818 Other osteoporosis without current pathological fracture: Secondary | ICD-10-CM | POA: Diagnosis not present

## 2020-09-15 DIAGNOSIS — I10 Essential (primary) hypertension: Secondary | ICD-10-CM | POA: Diagnosis not present

## 2020-09-15 DIAGNOSIS — F3341 Major depressive disorder, recurrent, in partial remission: Secondary | ICD-10-CM | POA: Diagnosis not present

## 2020-09-15 DIAGNOSIS — M545 Low back pain, unspecified: Secondary | ICD-10-CM | POA: Diagnosis not present

## 2020-09-15 DIAGNOSIS — M818 Other osteoporosis without current pathological fracture: Secondary | ICD-10-CM | POA: Diagnosis not present

## 2020-10-12 DIAGNOSIS — F3341 Major depressive disorder, recurrent, in partial remission: Secondary | ICD-10-CM | POA: Diagnosis not present

## 2020-10-12 DIAGNOSIS — I10 Essential (primary) hypertension: Secondary | ICD-10-CM | POA: Diagnosis not present

## 2020-10-12 DIAGNOSIS — M545 Low back pain, unspecified: Secondary | ICD-10-CM | POA: Diagnosis not present

## 2020-10-12 DIAGNOSIS — M818 Other osteoporosis without current pathological fracture: Secondary | ICD-10-CM | POA: Diagnosis not present

## 2020-10-24 DIAGNOSIS — F3341 Major depressive disorder, recurrent, in partial remission: Secondary | ICD-10-CM | POA: Diagnosis not present

## 2020-10-24 DIAGNOSIS — I161 Hypertensive emergency: Secondary | ICD-10-CM | POA: Diagnosis not present

## 2020-10-24 DIAGNOSIS — Z0000 Encounter for general adult medical examination without abnormal findings: Secondary | ICD-10-CM | POA: Diagnosis not present

## 2020-10-24 DIAGNOSIS — Z6829 Body mass index (BMI) 29.0-29.9, adult: Secondary | ICD-10-CM | POA: Diagnosis not present

## 2020-10-24 DIAGNOSIS — M545 Low back pain, unspecified: Secondary | ICD-10-CM | POA: Diagnosis not present

## 2020-10-24 DIAGNOSIS — M818 Other osteoporosis without current pathological fracture: Secondary | ICD-10-CM | POA: Diagnosis not present

## 2020-10-24 DIAGNOSIS — I10 Essential (primary) hypertension: Secondary | ICD-10-CM | POA: Diagnosis not present

## 2020-11-14 IMAGING — CR CHEST - 2 VIEW
2 series · 2 of 2 positions shown · non-contrast
Comparison: None.

CLINICAL DATA: Preop hip replacement surgery. History of
hypertension

EXAM:
CHEST - 2 VIEW

[w chest pa]
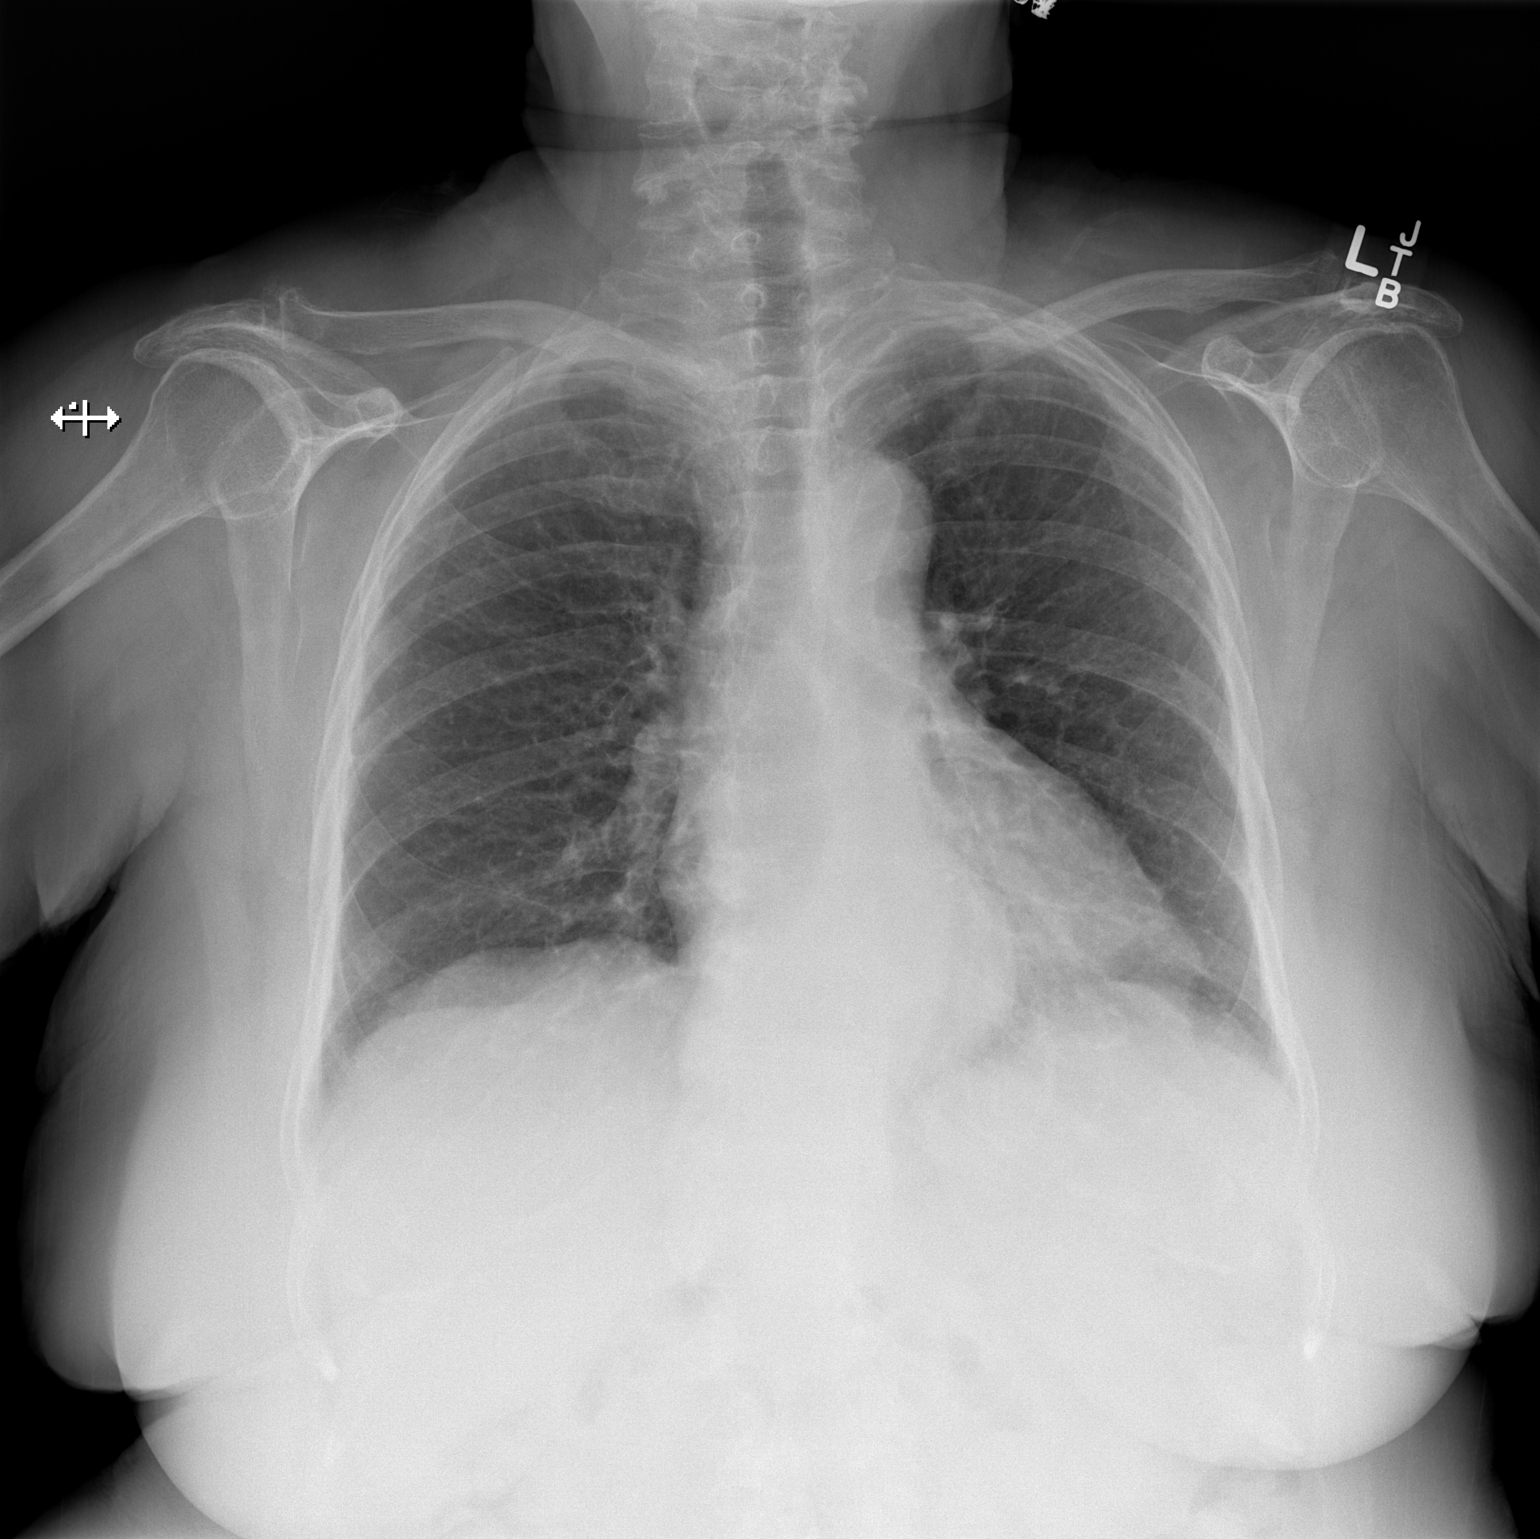

[w chest lat]
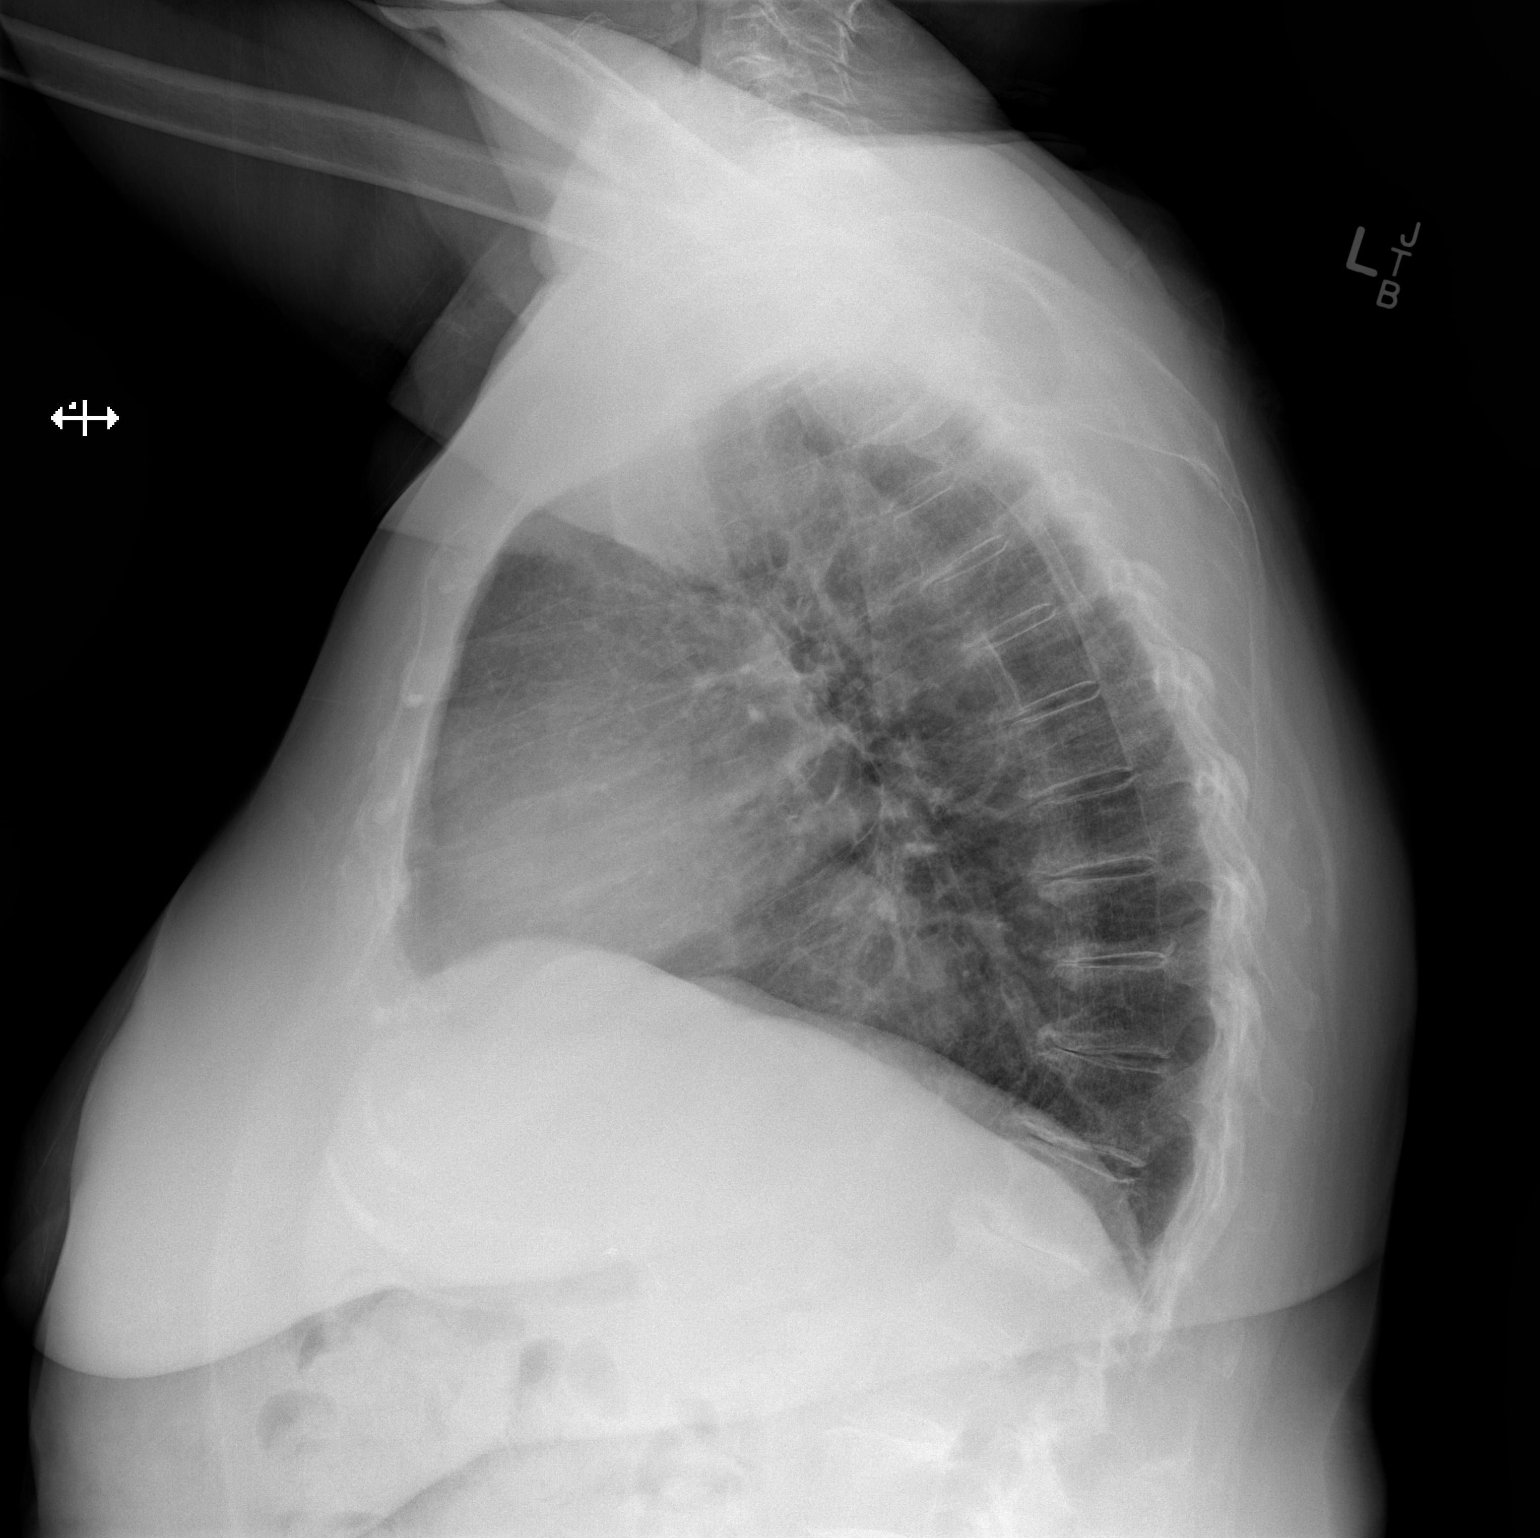

[2 of 2 positions shown; findings below may reference images not displayed]

FINDINGS: Heart is normal size. Lungs are clear. No effusions or edema. No
acute bony abnormality. Moderate-sized hiatal hernia.
IMPRESSION: Hiatal hernia.  No active disease.

## 2020-11-17 IMAGING — DX DG HIP (WITH OR WITHOUT PELVIS) 1V PORT LEFT
2 series · 2 of 2 positions shown · non-contrast
Comparison: Intraoperative imaging earlier today.

CLINICAL DATA: Status post left hip replacement today.

EXAM:
DG HIP (WITH OR WITHOUT PELVIS) 1V PORT LEFT

[pelvis ap]
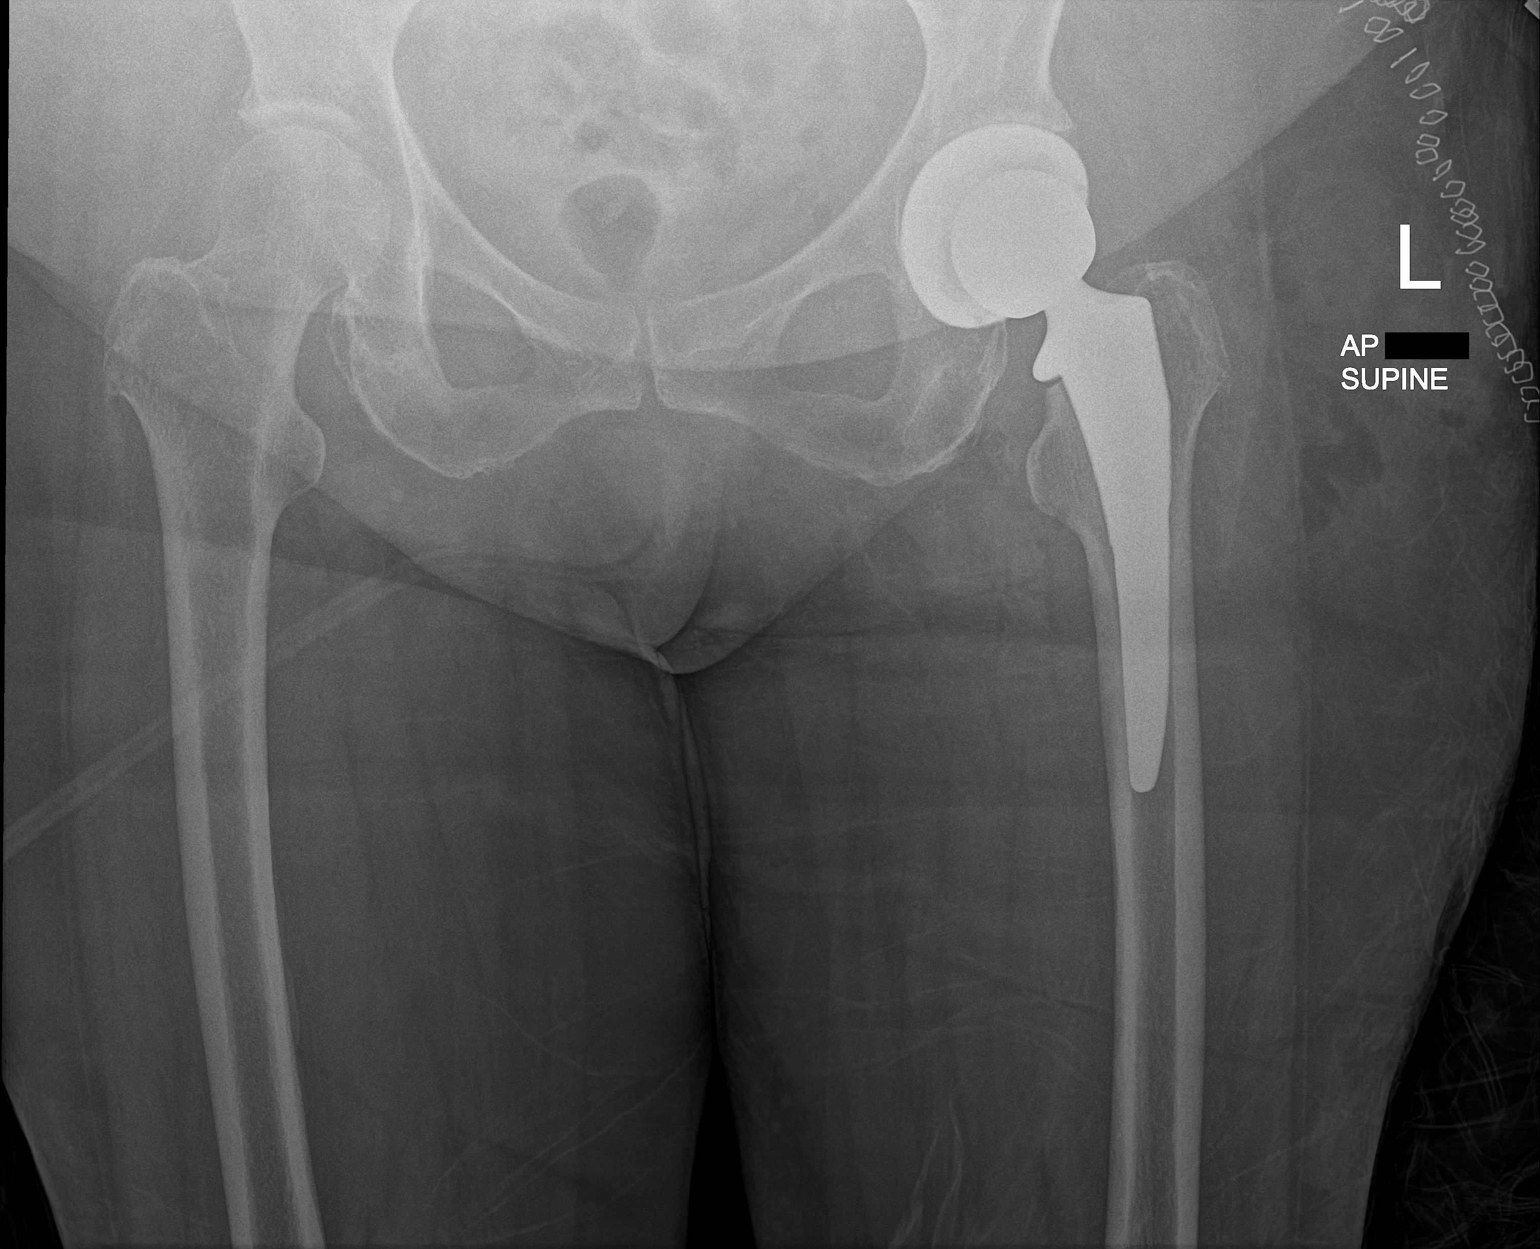

[hip lat]
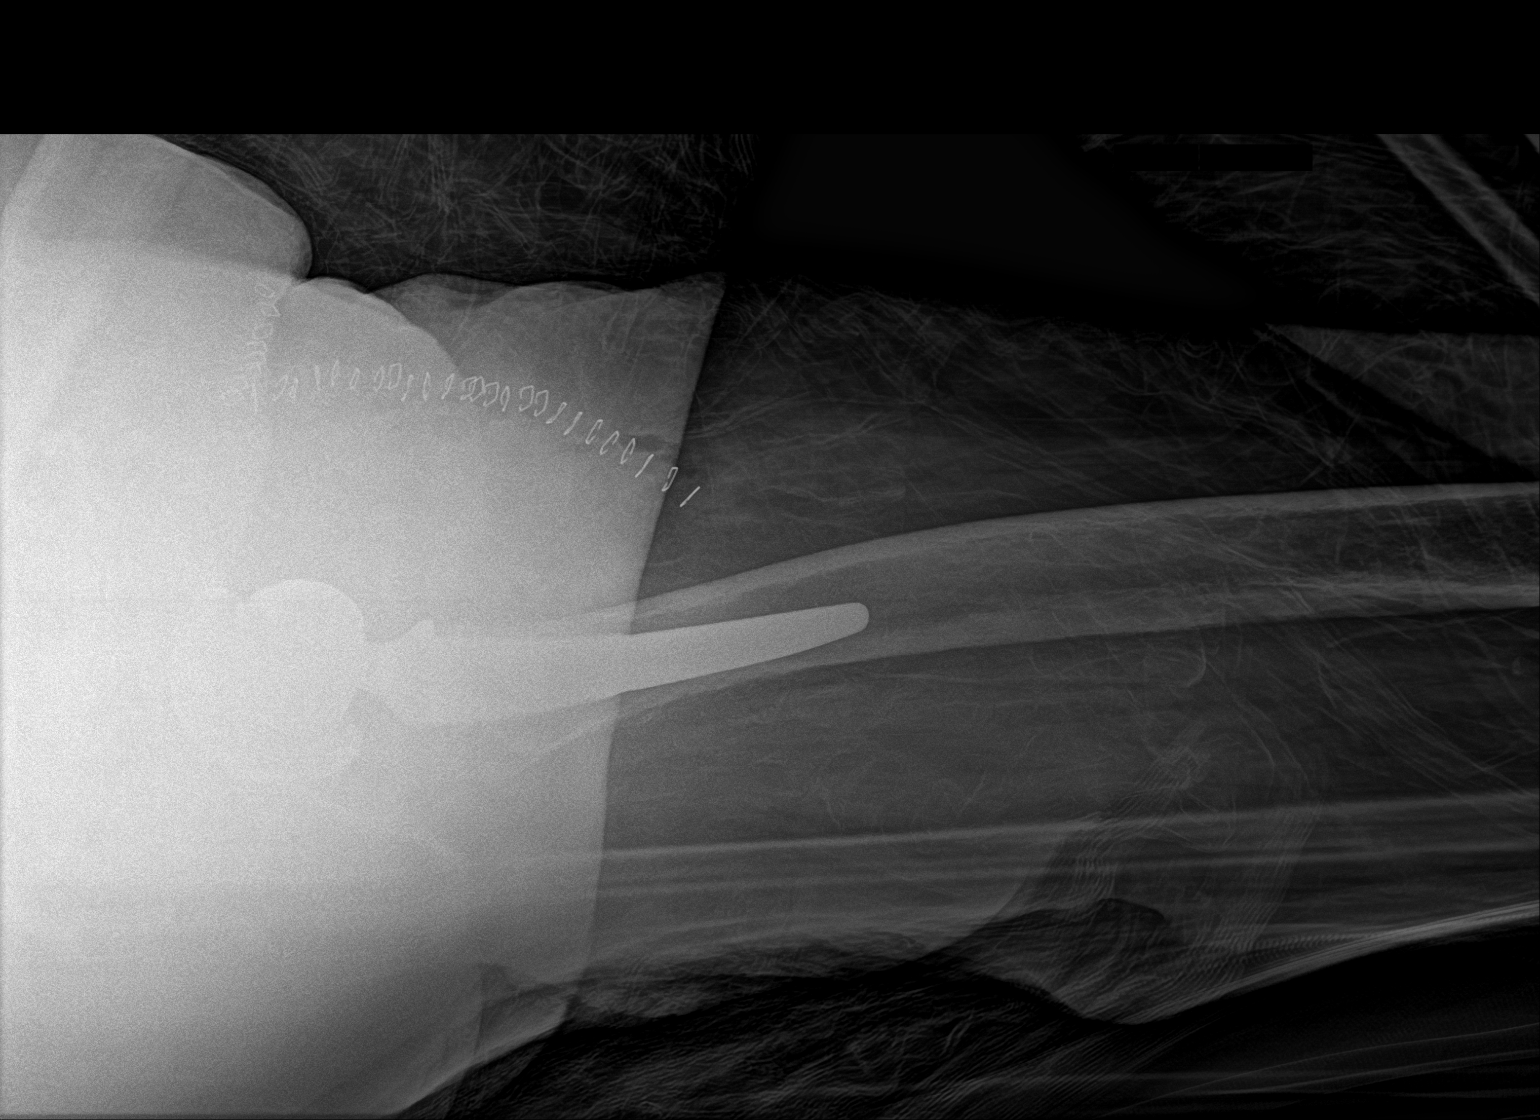

[2 of 2 positions shown; findings below may reference images not displayed]

FINDINGS: Left total hip arthroplasty is in place. The device is located. No
fracture. Surgical staples noted.
IMPRESSION: Status post left hip replacement today.  No acute finding.

## 2020-11-17 IMAGING — RF OPERATIVE LEFT HIP WITH PELVIS
1 series · 2 of 2 positions shown · non-contrast
Comparison: None.

CLINICAL DATA: Left hip replacement.

EXAM:
DG C-ARM 61-120 MIN; OPERATIVE LEFT HIP WITH PELVIS
FLUOROSCOPY TIME:  24 seconds.

[Series 1: run · 2 of 2 slices shown]
[im 1/2]
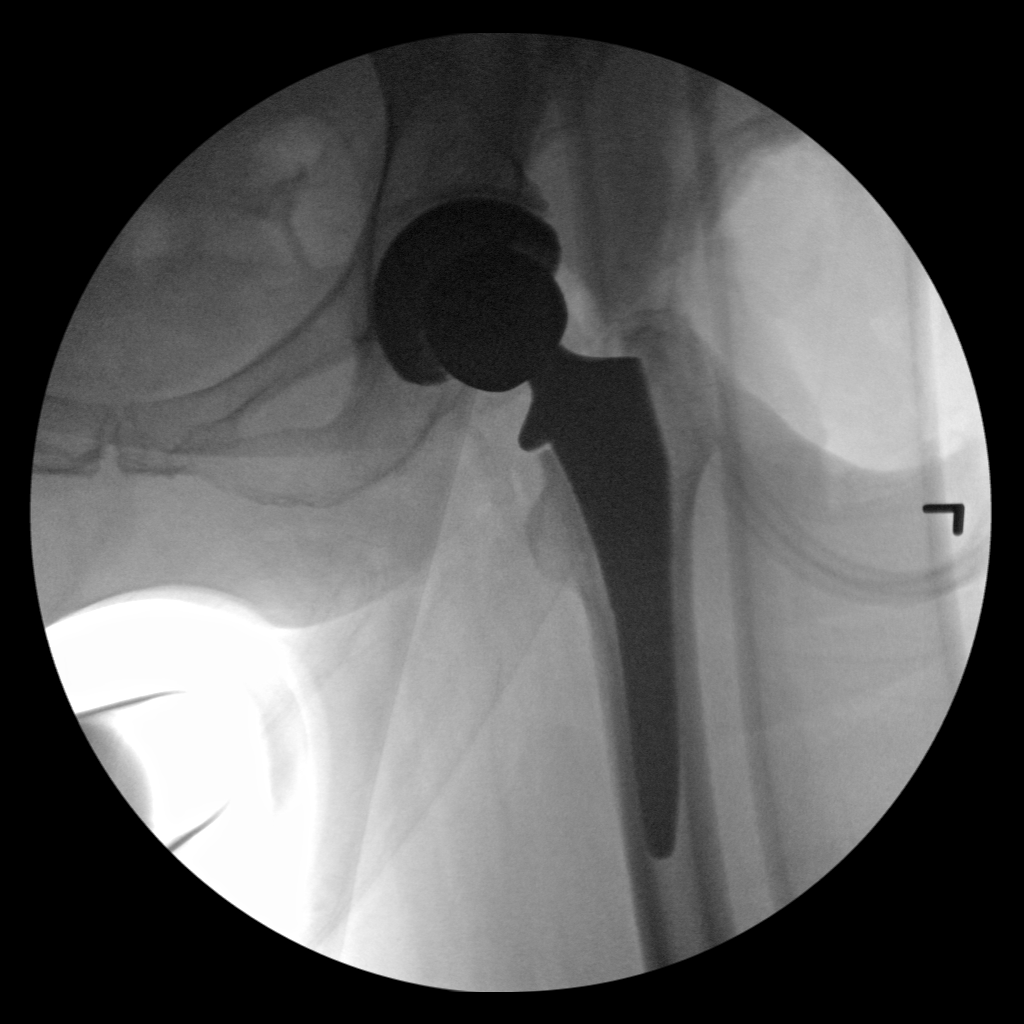
[im 2/2]
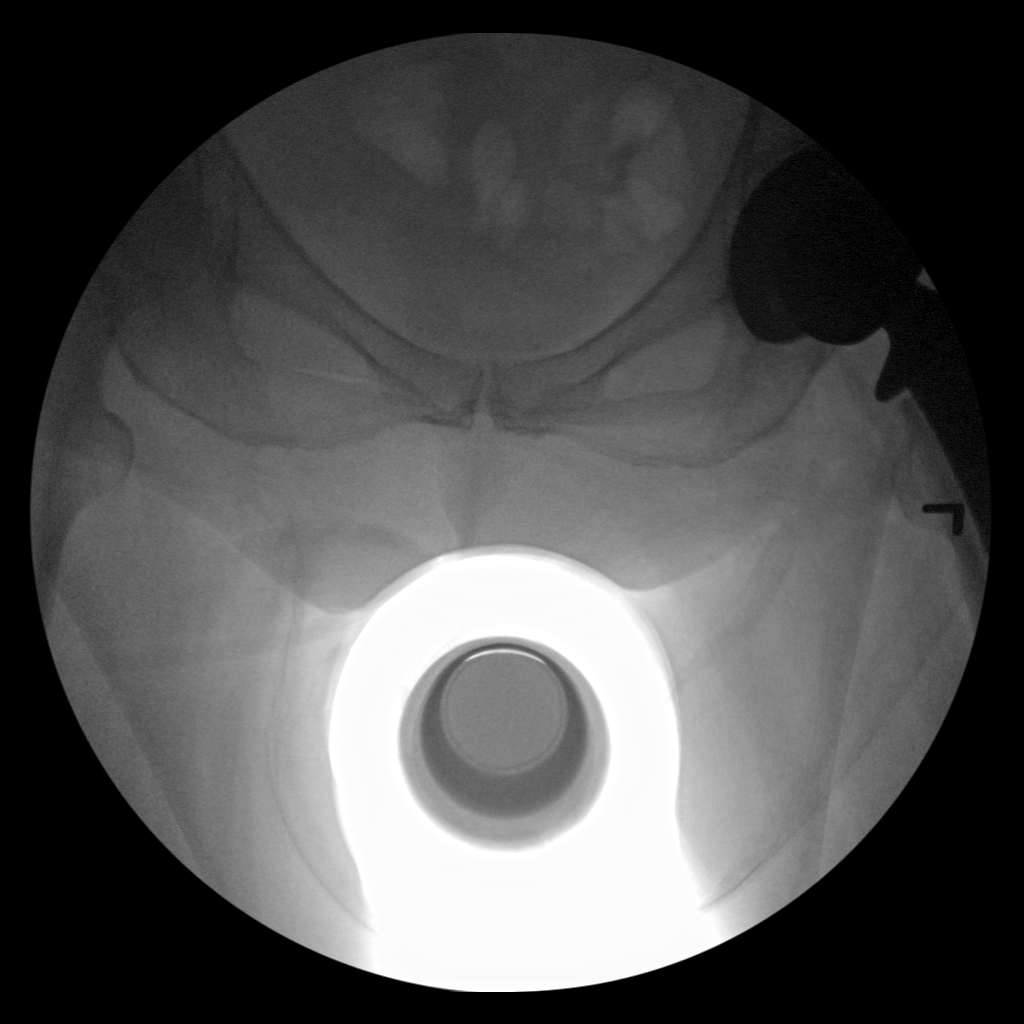

[2 of 2 positions shown; findings below may reference images not displayed]

FINDINGS: Two intraoperative fluoroscopic images were obtained of the left
hip. The left femoral and acetabular components appear to be well
situated. Expected postoperative changes are noted in the
surrounding soft tissues.
IMPRESSION: Fluoroscopic guidance provided during left total hip arthroplasty.

## 2021-12-06 ENCOUNTER — Encounter: Payer: Medicare PPO | Admitting: Obstetrics & Gynecology

## 2021-12-17 ENCOUNTER — Encounter: Payer: Medicare HMO | Admitting: Obstetrics & Gynecology

## 2022-01-15 ENCOUNTER — Encounter: Payer: Self-pay | Admitting: Obstetrics & Gynecology

## 2022-01-15 ENCOUNTER — Ambulatory Visit: Payer: Medicare HMO | Admitting: Obstetrics & Gynecology

## 2022-01-15 ENCOUNTER — Other Ambulatory Visit: Payer: Self-pay

## 2022-01-15 VITALS — BP 130/72 | HR 67

## 2022-01-15 DIAGNOSIS — N362 Urethral caruncle: Secondary | ICD-10-CM

## 2022-01-15 DIAGNOSIS — N3941 Urge incontinence: Secondary | ICD-10-CM

## 2022-01-15 DIAGNOSIS — L900 Lichen sclerosus et atrophicus: Secondary | ICD-10-CM | POA: Diagnosis not present

## 2022-01-15 DIAGNOSIS — B3731 Acute candidiasis of vulva and vagina: Secondary | ICD-10-CM

## 2022-01-15 MED ORDER — SOLIFENACIN SUCCINATE 10 MG PO TABS: 10.0000 mg | ORAL_TABLET | Freq: Every day | ORAL | 11 refills | Status: DC

## 2022-01-15 MED ORDER — FLUCONAZOLE 100 MG PO TABS: ORAL_TABLET | ORAL | 0 refills | Status: DC

## 2022-01-15 NOTE — Progress Notes (Signed)
? ? ? ? ?Chief Complaint  ?Patient presents with  ? Vaginal Bleeding  ?  Has seen some spotting, feels like something is out of place  ? ? ? ? ?86 y.o. No obstetric history on file. No LMP recorded. Patient has had a hysterectomy. The current method of family planning is status post hysterectomy. ? ?Outpatient Encounter Medications as of 01/15/2022  ?Medication Sig  ? alendronate (FOSAMAX) 70 MG tablet Take 70 mg by mouth once a week.   ? ALPRAZolam (XANAX) 0.5 MG tablet Take 0.5 mg by mouth 3 (three) times daily.  ? buPROPion (WELLBUTRIN SR) 100 MG 12 hr tablet Take 100 mg by mouth daily.   ? cholecalciferol (VITAMIN D3) 25 MCG (1000 UNIT) tablet Take 1,000 Units by mouth daily.  ? fluconazole (DIFLUCAN) 100 MG tablet 1 every other day til gone  ? gabapentin (NEURONTIN) 100 MG capsule   ? Grape Seed 100 MG CAPS Take by mouth.  ? Magnesium 250 MG TABS Take 250 mg by mouth daily.  ? metoprolol succinate (TOPROL-XL) 50 MG 24 hr tablet Take 50 mg by mouth daily.   ? Omega 3 1200 MG CAPS Take 1,200 mg by mouth daily.  ? omeprazole (PRILOSEC) 20 MG capsule Take 20 mg by mouth daily.   ? pravastatin (PRAVACHOL) 40 MG tablet Take 40 mg by mouth daily.   ? rOPINIRole (REQUIP) 0.5 MG tablet Take 0.5 mg by mouth at bedtime.   ? solifenacin (VESICARE) 10 MG tablet Take 1 tablet (10 mg total) by mouth daily.  ? traMADol (ULTRAM) 50 MG tablet   ? Turmeric 500 MG CAPS Take by mouth.  ? vitamin B-12 (CYANOCOBALAMIN) 1000 MCG tablet Take 1,000 mcg by mouth daily.  ? [DISCONTINUED] Biotin 5000 MCG TABS Take 5,000 mcg by mouth daily.  ? [DISCONTINUED] citalopram (CELEXA) 20 MG tablet Take 20 mg by mouth at bedtime.   ? [DISCONTINUED] diclofenac (VOLTAREN) 75 MG EC tablet Take 75 mg by mouth 2 (two) times daily.   ? [DISCONTINUED] gabapentin (NEURONTIN) 300 MG capsule Take 300 mg by mouth 4 (four) times daily.   ? [DISCONTINUED] HYDROcodone-acetaminophen (NORCO/VICODIN) 5-325 MG tablet Take 1-2 tablets by mouth every 6 (six) hours as  needed for moderate pain (pain score 4-6). Post op total hip arthroplasty (Patient not taking: Reported on 06/01/2019)  ? [DISCONTINUED] hydroxypropyl methylcellulose / hypromellose (ISOPTO TEARS / GONIOVISC) 2.5 % ophthalmic solution Place 1 drop into both eyes 3 (three) times daily as needed for dry eyes.  ? [DISCONTINUED] ketoconazole (NIZORAL) 2 % cream   ? [DISCONTINUED] ondansetron (ZOFRAN) 4 MG tablet   ? [DISCONTINUED] potassium chloride (KLOR-CON) 10 MEQ tablet   ? [DISCONTINUED] potassium chloride (MICRO-K) 10 MEQ CR capsule Take 10 mEq by mouth daily.   ? [DISCONTINUED] rivaroxaban (XARELTO) 10 MG TABS tablet Take 1 tablet (10 mg total) by mouth daily with breakfast.  ? ?No facility-administered encounter medications on file as of 01/15/2022.  ? ? ?Subjective ?As above ?Vaginal spotting noted ?Also has issues with frequency urgency and urinary incontinence ?Past Medical History:  ?Diagnosis Date  ? Anxiety   ? Arthritis   ? Complication of anesthesia   ? Hard to wake up and hallucinations  ? Depression   ? GERD (gastroesophageal reflux disease)   ? Hypertension   ? Migraines   ? PONV (postoperative nausea and vomiting)   ? Pulmonary embolism ([place])   ? ? ?Past Surgical History:  ?Procedure Laterality Date  ? ABDOMINAL HYSTERECTOMY  1972  ?  APPENDECTOMY  1973  ? cholecystecomy  2016  ? CHOLECYSTECTOMY    ? CYST EXCISION    ? between 4th & 5th Lumbar  ? FINGER AMPUTATION    ? Right 3rd & 4th  ? FOOT SURGERY    ? Bilateral   ? [place]  ? KNEE SURGERY Right 2005  ? SHOULDER ARTHROSCOPY    ? Left  ? TONSILLECTOMY  1973  ? TOTAL HIP ARTHROPLASTY Left 05/21/2019  ? TOTAL HIP ARTHROPLASTY Left 05/21/2019  ? Procedure: LEFT TOTAL HIP ARTHROPLASTY-DIRECT ANTERIOR APPROACH;  Surgeon: Marybelle Killings, MD;  Location: [facility];  Service: Orthopedics;  Laterality: Left;  ? ? ?OB History   ?No obstetric history on file. ?  ? ? ?Allergies  ?Allergen Reactions  ? Mushroom Extract Complex Nausea And Vomiting  ?   Mostly Portabella  ? Penicillins Anaphylaxis and Rash  ?  Did it involve swelling of the face/tongue/throat, SOB, or low BP? Yes ?Did it involve sudden or severe rash/hives, skin peeling, or any reaction on the inside of your mouth or nose? No ?Did you need to seek medical attention at a hospital or doctor's office? Yes ?When did it last happen?      Unknown ?If all above answers are ?NO?, may proceed with cephalosporin use. ?  ? Propofol Other (See Comments)  ?  Headache ?Lethargy ?  ? Sulfamethoxazole Other (See Comments)  ? Morphine Anxiety  ?  Chest pain  ? ?  ? ? ?Social History  ? ?Socioeconomic History  ? Marital status: Married  ?  Spouse name: Not on file  ? Number of children: Not on file  ? Years of education: Not on file  ? Highest education level: Not on file  ?Occupational History  ? Not on file  ?Tobacco Use  ? Smoking status: Never  ? Smokeless tobacco: Never  ?Vaping Use  ? Vaping Use: Never used  ?Substance and Sexual Activity  ? Alcohol use: Yes  ?  Comment: occ  ? Drug use: Never  ? Sexual activity: Not on file  ?Other Topics Concern  ? Not on file  ?Social History Narrative  ? Not on file  ? ?Social Determinants of Health  ? ?Financial Resource Strain: Not on file  ?Food Insecurity: Not on file  ?Transportation Needs: Not on file  ?Physical Activity: Not on file  ?Stress: Not on file  ?Social Connections: Not on file  ? ? ?Family History  ?Problem Relation Age of Onset  ? Pneumonia Father   ? Coronary artery disease Mother   ? ? ?Medications:       ?Current Outpatient Medications:  ?  alendronate (FOSAMAX) 70 MG tablet, Take 70 mg by mouth once a week. , Disp: , Rfl:  ?  ALPRAZolam (XANAX) 0.5 MG tablet, Take 0.5 mg by mouth 3 (three) times daily., Disp: , Rfl:  ?  buPROPion (WELLBUTRIN SR) 100 MG 12 hr tablet, Take 100 mg by mouth daily. , Disp: , Rfl:  ?  cholecalciferol (VITAMIN D3) 25 MCG (1000 UNIT) tablet, Take 1,000 Units by mouth daily., Disp: , Rfl:  ?  fluconazole (DIFLUCAN) 100 MG  tablet, 1 every other day til gone, Disp: 7 tablet, Rfl: 0 ?  gabapentin (NEURONTIN) 100 MG capsule, , Disp: , Rfl:  ?  Grape Seed 100 MG CAPS, Take by mouth., Disp: , Rfl:  ?  Magnesium 250 MG TABS, Take 250 mg by mouth daily., Disp: , Rfl:  ?  metoprolol succinate (TOPROL-XL) 50 MG 24 hr tablet, Take 50 mg by mouth daily. , Disp: , Rfl:  ?  Omega 3 1200 MG CAPS, Take 1,200 mg by mouth daily., Disp: , Rfl:  ?  omeprazole (PRILOSEC) 20 MG capsule, Take 20 mg by mouth daily. , Disp: , Rfl:  ?  pravastatin (PRAVACHOL) 40 MG tablet, Take 40 mg by mouth daily. , Disp: , Rfl:  ?  rOPINIRole (REQUIP) 0.5 MG tablet, Take 0.5 mg by mouth at bedtime. , Disp: , Rfl:  ?  solifenacin (VESICARE) 10 MG tablet, Take 1 tablet (10 mg total) by mouth daily., Disp: 30 tablet, Rfl: 11 ?  traMADol (ULTRAM) 50 MG tablet, , Disp: , Rfl:  ?  Turmeric 500 MG CAPS, Take by mouth., Disp: , Rfl:  ?  vitamin B-12 (CYANOCOBALAMIN) 1000 MCG tablet, Take 1,000 mcg by mouth daily., Disp: , Rfl:  ?  mirabegron ER (MYRBETRIQ) 50 MG TB24 tablet, Take 1 tablet (50 mg total) by mouth daily., Disp: 30 tablet, Rfl: 11 ? ?Objective ?Blood pressure 130/72, pulse 67. ? ?General WDWN female NAD ?Vulva:  erythema candidal vulvitis versus pad vulvitis or both ?Vagina: Atrophic normal mucosa urethral carbuncle present ?Cervix: Surgically absent ?Uterus: Surgically absent ?Adnexa: Negative ? ? ?Pertinent ROS ?As per HPI ? ?Labs or studies ? ? ? ? ?Impression ?Diagnoses this Encounter:: ?  ICD-10-CM   ?1. 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 Mild, I could use estrogen cream but really do not see ann indication at this point, pt ressured she may have spotting from time to time as a result   ?  ?2. Urge incontinence  N39.41   ? begin vesicare 10 mg qhs  ?  ?3. Lichen sclerosus et atrophicus vs pad vulvitis  L90.0   ? will treat the vulvovaginal candida first then reassess, probably begin lidex or temovate therapy vs fanny cream  ?  ?4. Candidal vulvovaginitis  B37.31   ?  Gentian violet done today, diflucan 100 mg every other day for 7 doses  ?  ? ? ?Established relevant diagnosis(es): ? ? ?Plan/Recommendations: ?Meds ordered this encounter  ?Medications  ? solifenacin (VESICA

## 2022-01-16 ENCOUNTER — Telehealth: Payer: Self-pay | Admitting: *Deleted

## 2022-01-16 NOTE — Telephone Encounter (Signed)
Pharmacist called to let us know that patients insurance will not cover Vesicare. She needs to try and fail Oxybutynin or Myrbetriq first. Can you send in a new prescription for her? ?

## 2022-01-17 ENCOUNTER — Other Ambulatory Visit: Payer: Self-pay | Admitting: Obstetrics & Gynecology

## 2022-01-17 ENCOUNTER — Telehealth: Payer: Self-pay | Admitting: *Deleted

## 2022-01-17 MED ORDER — MIRABEGRON ER 50 MG PO TB24: 50.0000 mg | ORAL_TABLET | Freq: Every day | ORAL | 11 refills | Status: AC

## 2022-01-17 NOTE — Telephone Encounter (Signed)
Ok I  e prescibed myrbetriq 50

## 2022-01-17 NOTE — Telephone Encounter (Signed)
Error. JSY 

## 2022-01-25 ENCOUNTER — Telehealth: Payer: Self-pay | Admitting: Obstetrics & Gynecology

## 2022-01-25 NOTE — Telephone Encounter (Signed)
Patient calling stating that she has some blisters that have come up between her toes and wanting to know if her medicnes could be the cause of it. She is also taking two new meds that another doctor gave her that she wants to talk about. She is asking if someone will call her back.  ?

## 2022-01-25 NOTE — Telephone Encounter (Signed)
Called pt, bad connection, sent mychart msg. ?

## 2022-01-31 ENCOUNTER — Encounter: Payer: Self-pay | Admitting: Obstetrics & Gynecology

## 2022-01-31 ENCOUNTER — Ambulatory Visit: Payer: Medicare HMO | Admitting: Obstetrics & Gynecology

## 2022-01-31 VITALS — BP 134/81 | HR 57 | Ht 64.0 in | Wt 168.5 lb

## 2022-01-31 DIAGNOSIS — N3941 Urge incontinence: Secondary | ICD-10-CM | POA: Diagnosis not present

## 2022-01-31 DIAGNOSIS — N763 Subacute and chronic vulvitis: Secondary | ICD-10-CM | POA: Diagnosis not present

## 2022-01-31 DIAGNOSIS — B3731 Acute candidiasis of vulva and vagina: Secondary | ICD-10-CM

## 2022-01-31 DIAGNOSIS — S90822A Blister (nonthermal), left foot, initial encounter: Secondary | ICD-10-CM

## 2022-01-31 DIAGNOSIS — N362 Urethral caruncle: Secondary | ICD-10-CM | POA: Diagnosis not present

## 2022-01-31 MED ORDER — SILVER SULFADIAZINE 1 % EX CREA
TOPICAL_CREAM | CUTANEOUS | 11 refills | Status: AC
Start: 2022-01-31 — End: ?

## 2022-01-31 NOTE — Progress Notes (Signed)
Follow up appointment for results: Response to therapy  Chief Complaint  Patient presents with   Follow-up    Blood pressure 134/81, pulse (!) 57, height 5\' 4"  (1.626 m), weight 168 lb 8 oz (76.4 kg).  No results found.  Pt is much improved overall Will continue recommendations Re eval 3 months  MEDS ordered this encounter: Meds ordered this encounter  Medications   silver sulfADIAZINE (SILVADENE) 1 % cream    Sig: Use to foot 3 times a day    Dispense:  50 g    Refill:  11    Orders for this encounter: No orders of the defined types were placed in this encounter.   Impression + Management Plan   ICD-10-CM   1. Urge incontinence  N39.41     2. Urethral caruncle  N36.2     3. Candidal vulvovaginitis  B37.31     4. Chronic vulvitis, wet pad related  N76.3    Rx: Fanny cream to use qhs and in am(zinc oxide+antifungal+steroid)-->sent to [place] for compounding    5. Blister (nonthermal), left foot, initial encounter  [phone]    Rx: silvadene cream to use TID      Follow Up: Return in about 3 months (around 05/03/2022) for Follow up, with Dr Elonda Husky.     All questions were answered.  Past Medical History:  Diagnosis Date   Anxiety    Arthritis    Complication of anesthesia    Hard to wake up and hallucinations   Depression    GERD (gastroesophageal reflux disease)    Hypertension    Migraines    PONV (postoperative nausea and vomiting)    Pulmonary embolism ([facility])     Past Surgical History:  Procedure Laterality Date   ABDOMINAL HYSTERECTOMY  1972   APPENDECTOMY  1973   cholecystecomy  2016   CHOLECYSTECTOMY     CYST EXCISION     between 4th & 5th Lumbar   FINGER AMPUTATION     Right 3rd & 4th   FOOT SURGERY     Bilateral    HEMORRHOID SURGERY  1971   KNEE SURGERY Right 2005   SHOULDER ARTHROSCOPY     Left   TONSILLECTOMY  1973   TOTAL HIP ARTHROPLASTY Left 05/21/2019   TOTAL HIP ARTHROPLASTY Left 05/21/2019   Procedure: LEFT  TOTAL HIP ARTHROPLASTY-DIRECT ANTERIOR APPROACH;  Surgeon: Marybelle Killings, MD;  Location: [facility];  Service: Orthopedics;  Laterality: Left;    OB History     Gravida  2   Para  2   Term  2   Preterm      AB      Living  2      SAB      IAB      Ectopic      Multiple      Live Births  2           Allergies  Allergen Reactions   Mushroom Extract Complex Nausea And Vomiting    Mostly Portabella   Penicillins Anaphylaxis and Rash    Did it involve swelling of the face/tongue/throat, SOB, or low BP? Yes Did it involve sudden or severe rash/hives, skin peeling, or any reaction on the inside of your mouth or nose? No Did you need to seek medical attention at a hospital or doctor's office? Yes When did it last happen?      Unknown If all above answers are "NO", may proceed with cephalosporin  use.    Propofol Other (See Comments)    Headache Lethargy    Sulfamethoxazole Other (See Comments)   Morphine Anxiety    Chest pain       Social History   Socioeconomic History   Marital status: Married    Spouse name: Not on file   Number of children: Not on file   Years of education: Not on file   Highest education level: Not on file  Occupational History   Not on file  Tobacco Use   Smoking status: Never   Smokeless tobacco: Never  Vaping Use   Vaping Use: Never used  Substance and Sexual Activity   Alcohol use: Yes    Comment: occ   Drug use: Never   Sexual activity: Not on file    Comment: hyst  Other Topics Concern   Not on file  Social History Narrative   Not on file   Social Determinants of Health   Financial Resource Strain: Not on file  Food Insecurity: Not on file  Transportation Needs: Not on file  Physical Activity: Not on file  Stress: Not on file  Social Connections: Not on file    Family History  Problem Relation Age of Onset   Pneumonia Father    Coronary artery disease Mother

## 2022-04-02 ENCOUNTER — Ambulatory Visit: Payer: Medicare HMO | Admitting: Obstetrics & Gynecology

## 2022-04-08 ENCOUNTER — Ambulatory Visit: Payer: Medicare HMO | Admitting: Obstetrics & Gynecology

## 2022-07-23 ENCOUNTER — Ambulatory Visit: Payer: Medicare HMO | Admitting: Obstetrics & Gynecology

## 2023-08-27 ENCOUNTER — Encounter (HOSPITAL_COMMUNITY): Payer: Medicare HMO

## 2023-08-27 ENCOUNTER — Encounter: Payer: Medicare HMO | Admitting: Vascular Surgery

## 2024-01-21 ENCOUNTER — Ambulatory Visit: Admitting: Orthopaedic Surgery

## 2024-01-21 ENCOUNTER — Other Ambulatory Visit (INDEPENDENT_AMBULATORY_CARE_PROVIDER_SITE_OTHER)

## 2024-01-21 DIAGNOSIS — M79672 Pain in left foot: Secondary | ICD-10-CM

## 2024-01-21 DIAGNOSIS — M79671 Pain in right foot: Secondary | ICD-10-CM | POA: Diagnosis not present

## 2024-01-21 NOTE — Progress Notes (Signed)
 Office Visit Note   Patient: Kimberly Wood           Date of Birth: [birth year]           MRN: [id number] Visit Date: 01/21/2024              Requested by: Toma Deiters, MD [address] [place],  [place] [address] PCP: Toma Deiters, MD   Assessment & Plan: Visit Diagnoses:  1. Bilateral foot pain     Plan: Would recommend patient have shoes that have a wider toebox higher arch buildup.  Tennis shoe such as a 6 or new balance would be better choices and those that do not have arch buildup.  If this not effective she can look for some arch supports she can get over-the-counter at [facility].  Follow-Up Instructions: No follow-ups on file.   Orders:  Orders Placed This Encounter  Procedures   XR Foot Complete Left   XR Foot Complete Right   No orders of the defined types were placed in this encounter.     Procedures: No procedures performed   Clinical Data: No additional findings.   Subjective: Chief Complaint  Patient presents with   Right Foot - Pain   Left Foot - Pain    HPI 88 year old female had previous total of arthroplasty by me 2020 on the left with rheumatoid arthritis having bilateral foot pain.  She has had previous foot surgeries with bunion deformities PIP fusion second toe both right and left.  She has had some problems with calluses over bunionette as well as mildly over the bunion.  Some tenderness over the right foot base of the fifth metatarsal.  She has shoes that do not have marked buildup currently.  Review of Systems all systems updated unchanged   Objective: Vital Signs: There were no vitals taken for this visit.  Physical Exam Constitutional:      Appearance: She is well-developed.  HENT:     Head: Normocephalic.     Right Ear: External ear normal.     Left Ear: External ear normal. There is no impacted cerumen.  Eyes:     Pupils: Pupils are equal, round, and reactive to light.  Neck:     Thyroid: No thyromegaly.      Trachea: No tracheal deviation.  Cardiovascular:     Rate and Rhythm: Normal rate.  Pulmonary:     Effort: Pulmonary effort is normal.  Abdominal:     Palpations: Abdomen is soft.  Musculoskeletal:     Cervical back: No rigidity.  Skin:    General: Skin is warm and dry.  Neurological:     Mental Status: She is alert and oriented to person, place, and time.  Psychiatric:        Behavior: Behavior normal.     Ortho Exam ulnar graft metacarpals rheumatoid involvement both hands.  Tenderness to the base of the fifth she has high arch cavus foot.  Some calluses over the fifth metatarsal head.  Well-healed bunions scar from distal metatarsal osteotomy.  Some atrophy and some prominence of metatarsal heads over the plantar surface without skin breakdown.  Specialty Comments:  No specialty comments available.  Imaging: XR Foot Complete Right Result Date: 01/21/2024 Three-view x-rays right foot obtained and reviewed.  There is third metatarsal head erosive changes consistent with rheumatoid arthritis.  Prominent high arch without midfoot rheumatoid erosive changes.  Previous first metatarsal distal osteotomy for bunion correction and second PIP fusion solid.  Impression: Third metatarsal M TP rheumatoid changes.  Previous bunion surgery and second toe PIP fusion as described above. Impression:  XR Foot Complete Left Result Date: 01/21/2024 AP left foot oblique left foot and lateral demonstrates previous distal first metatarsal bunion surgery healed osteotomy.  Second PIP fusion.  Prominent arch.  Negative for acute fracture. Impression: Previous left foot bunion surgery and PIP fusion.  Negative for significant arthropathy.    PMFS History: Patient Active Problem List   Diagnosis Date Noted   Other intervertebral disc degeneration, lumbar region 01/03/2020   Procedure and treatment not carried out due to patient leaving prior to being seen by health care provider 07/15/2019   Hx of total  hip arthroplasty, left 06/01/2019   Rheumatoid arthritis involving left hip ([facility]) 05/01/2019   Past Medical History:  Diagnosis Date   Anxiety    Arthritis    Complication of anesthesia    Hard to wake up and hallucinations   Depression    GERD (gastroesophageal reflux disease)    Hypertension    Migraines    PONV (postoperative nausea and vomiting)    Pulmonary embolism ([facility])     Family History  Problem Relation Age of Onset   Pneumonia Father    Coronary artery disease Mother     Past Surgical History:  Procedure Laterality Date   ABDOMINAL HYSTERECTOMY  1972   APPENDECTOMY  1973   cholecystecomy  2016   CHOLECYSTECTOMY     CYST EXCISION     between 4th & 5th Lumbar   FINGER AMPUTATION     Right 3rd & 4th   FOOT SURGERY     Bilateral    HEMORRHOID SURGERY  1971   KNEE SURGERY Right 2005   SHOULDER ARTHROSCOPY     Left   TONSILLECTOMY  1973   TOTAL HIP ARTHROPLASTY Left 05/21/2019   TOTAL HIP ARTHROPLASTY Left 05/21/2019   Procedure: LEFT TOTAL HIP ARTHROPLASTY-DIRECT ANTERIOR APPROACH;  Surgeon: Eldred Manges, MD;  Location: [facility];  Service: Orthopedics;  Laterality: Left;   Social History   Occupational History   Not on file  Tobacco Use   Smoking status: Never   Smokeless tobacco: Never  Vaping Use   Vaping status: Never Used  Substance and Sexual Activity   Alcohol use: Yes    Comment: occ   Drug use: Never   Sexual activity: Not on file    Comment: hyst

## 2024-02-09 DIAGNOSIS — M0550 Rheumatoid polyneuropathy with rheumatoid arthritis of unspecified site: Secondary | ICD-10-CM | POA: Diagnosis not present

## 2024-02-09 DIAGNOSIS — F411 Generalized anxiety disorder: Secondary | ICD-10-CM | POA: Diagnosis not present

## 2024-02-09 DIAGNOSIS — I739 Peripheral vascular disease, unspecified: Secondary | ICD-10-CM | POA: Diagnosis not present

## 2024-02-09 DIAGNOSIS — M818 Other osteoporosis without current pathological fracture: Secondary | ICD-10-CM | POA: Diagnosis not present

## 2024-02-09 DIAGNOSIS — I10 Essential (primary) hypertension: Secondary | ICD-10-CM | POA: Diagnosis not present

## 2024-02-09 DIAGNOSIS — E782 Mixed hyperlipidemia: Secondary | ICD-10-CM | POA: Diagnosis not present

## 2024-02-09 DIAGNOSIS — N182 Chronic kidney disease, stage 2 (mild): Secondary | ICD-10-CM | POA: Diagnosis not present

## 2024-02-09 DIAGNOSIS — K219 Gastro-esophageal reflux disease without esophagitis: Secondary | ICD-10-CM | POA: Diagnosis not present

## 2024-02-09 DIAGNOSIS — I700 Atherosclerosis of aorta: Secondary | ICD-10-CM | POA: Diagnosis not present

## 2024-02-09 DIAGNOSIS — S68111D Complete traumatic metacarpophalangeal amputation of left index finger, subsequent encounter: Secondary | ICD-10-CM | POA: Diagnosis not present

## 2024-05-10 DIAGNOSIS — J301 Allergic rhinitis due to pollen: Secondary | ICD-10-CM | POA: Diagnosis not present

## 2024-05-10 DIAGNOSIS — M5431 Sciatica, right side: Secondary | ICD-10-CM | POA: Diagnosis not present

## 2024-05-10 DIAGNOSIS — H814 Vertigo of central origin: Secondary | ICD-10-CM | POA: Diagnosis not present

## 2024-05-10 DIAGNOSIS — Z6827 Body mass index (BMI) 27.0-27.9, adult: Secondary | ICD-10-CM | POA: Diagnosis not present

## 2024-05-10 DIAGNOSIS — I10 Essential (primary) hypertension: Secondary | ICD-10-CM | POA: Diagnosis not present

## 2024-05-12 DIAGNOSIS — M5135 Other intervertebral disc degeneration, thoracolumbar region: Secondary | ICD-10-CM | POA: Diagnosis not present

## 2024-05-12 DIAGNOSIS — M47814 Spondylosis without myelopathy or radiculopathy, thoracic region: Secondary | ICD-10-CM | POA: Diagnosis not present

## 2024-05-12 DIAGNOSIS — M545 Low back pain, unspecified: Secondary | ICD-10-CM | POA: Diagnosis not present

## 2024-05-12 DIAGNOSIS — M47812 Spondylosis without myelopathy or radiculopathy, cervical region: Secondary | ICD-10-CM | POA: Diagnosis not present

## 2024-05-12 DIAGNOSIS — M47817 Spondylosis without myelopathy or radiculopathy, lumbosacral region: Secondary | ICD-10-CM | POA: Diagnosis not present

## 2024-05-12 DIAGNOSIS — M4316 Spondylolisthesis, lumbar region: Secondary | ICD-10-CM | POA: Diagnosis not present

## 2024-05-12 DIAGNOSIS — M546 Pain in thoracic spine: Secondary | ICD-10-CM | POA: Diagnosis not present

## 2024-08-09 DIAGNOSIS — N182 Chronic kidney disease, stage 2 (mild): Secondary | ICD-10-CM | POA: Diagnosis not present

## 2024-08-09 DIAGNOSIS — I10 Essential (primary) hypertension: Secondary | ICD-10-CM | POA: Diagnosis not present

## 2024-08-10 DIAGNOSIS — H814 Vertigo of central origin: Secondary | ICD-10-CM | POA: Diagnosis not present

## 2024-08-10 DIAGNOSIS — M5431 Sciatica, right side: Secondary | ICD-10-CM | POA: Diagnosis not present

## 2024-08-10 DIAGNOSIS — J301 Allergic rhinitis due to pollen: Secondary | ICD-10-CM | POA: Diagnosis not present

## 2024-08-10 DIAGNOSIS — F411 Generalized anxiety disorder: Secondary | ICD-10-CM | POA: Diagnosis not present

## 2024-08-10 DIAGNOSIS — I10 Essential (primary) hypertension: Secondary | ICD-10-CM | POA: Diagnosis not present

## 2024-08-10 DIAGNOSIS — Z6827 Body mass index (BMI) 27.0-27.9, adult: Secondary | ICD-10-CM | POA: Diagnosis not present

## 2024-08-20 DIAGNOSIS — J301 Allergic rhinitis due to pollen: Secondary | ICD-10-CM | POA: Diagnosis not present

## 2024-08-20 DIAGNOSIS — M5431 Sciatica, right side: Secondary | ICD-10-CM | POA: Diagnosis not present

## 2024-08-20 DIAGNOSIS — H814 Vertigo of central origin: Secondary | ICD-10-CM | POA: Diagnosis not present

## 2024-08-20 DIAGNOSIS — I10 Essential (primary) hypertension: Secondary | ICD-10-CM | POA: Diagnosis not present

## 2024-09-06 ENCOUNTER — Encounter: Payer: Self-pay | Admitting: Radiology

## 2024-09-07 DIAGNOSIS — N182 Chronic kidney disease, stage 2 (mild): Secondary | ICD-10-CM | POA: Diagnosis not present

## 2024-09-07 DIAGNOSIS — I10 Essential (primary) hypertension: Secondary | ICD-10-CM | POA: Diagnosis not present
